# Patient Record
Sex: Female | Born: 1935 | Race: Black or African American | Hispanic: No | Smoking: Never smoker
Health system: Southern US, Community
[De-identification: ages and names within clinical notes are randomized; demographics above are authoritative.]

## PROBLEM LIST (undated history)

## (undated) DIAGNOSIS — I10 Essential (primary) hypertension: Secondary | ICD-10-CM

## (undated) DIAGNOSIS — H409 Unspecified glaucoma: Secondary | ICD-10-CM

## (undated) DIAGNOSIS — E7800 Pure hypercholesterolemia, unspecified: Secondary | ICD-10-CM

## (undated) HISTORY — PX: CATARACT EXTRACTION: SUR2

---

## 2003-08-18 ENCOUNTER — Ambulatory Visit (HOSPITAL_COMMUNITY): Admission: RE | Admit: 2003-08-18 | Discharge: 2003-08-18 | Payer: Self-pay | Admitting: Family Medicine

## 2004-08-22 ENCOUNTER — Ambulatory Visit (HOSPITAL_COMMUNITY): Admission: RE | Admit: 2004-08-22 | Discharge: 2004-08-22 | Payer: Self-pay | Admitting: Family Medicine

## 2004-09-05 ENCOUNTER — Ambulatory Visit (HOSPITAL_COMMUNITY): Admission: RE | Admit: 2004-09-05 | Discharge: 2004-09-05 | Payer: Self-pay | Admitting: Internal Medicine

## 2004-09-05 ENCOUNTER — Ambulatory Visit: Payer: Self-pay | Admitting: Internal Medicine

## 2005-09-19 ENCOUNTER — Ambulatory Visit (HOSPITAL_COMMUNITY): Admission: RE | Admit: 2005-09-19 | Discharge: 2005-09-19 | Payer: Self-pay | Admitting: Family Medicine

## 2006-10-16 ENCOUNTER — Ambulatory Visit (HOSPITAL_COMMUNITY): Admission: RE | Admit: 2006-10-16 | Discharge: 2006-10-16 | Payer: Self-pay | Admitting: Family Medicine

## 2007-10-31 ENCOUNTER — Ambulatory Visit (HOSPITAL_COMMUNITY): Admission: RE | Admit: 2007-10-31 | Discharge: 2007-10-31 | Payer: Self-pay | Admitting: Family Medicine

## 2008-11-16 ENCOUNTER — Ambulatory Visit (HOSPITAL_COMMUNITY): Admission: RE | Admit: 2008-11-16 | Discharge: 2008-11-16 | Payer: Self-pay | Admitting: Family Medicine

## 2009-11-18 ENCOUNTER — Ambulatory Visit (HOSPITAL_COMMUNITY): Admission: RE | Admit: 2009-11-18 | Discharge: 2009-11-18 | Payer: Self-pay | Admitting: Family Medicine

## 2010-10-28 ENCOUNTER — Other Ambulatory Visit (HOSPITAL_COMMUNITY): Payer: Self-pay | Admitting: Family Medicine

## 2010-10-28 DIAGNOSIS — Z139 Encounter for screening, unspecified: Secondary | ICD-10-CM

## 2010-11-21 ENCOUNTER — Ambulatory Visit (HOSPITAL_COMMUNITY): Admission: RE | Admit: 2010-11-21 | Payer: Medicare Other | Source: Ambulatory Visit

## 2010-12-06 ENCOUNTER — Ambulatory Visit (HOSPITAL_COMMUNITY)
Admission: RE | Admit: 2010-12-06 | Discharge: 2010-12-06 | Disposition: A | Discharge status: Home / Self Care | Payer: Medicare Other | Source: Ambulatory Visit | Attending: Family Medicine | Admitting: Family Medicine

## 2010-12-06 DIAGNOSIS — Z1231 Encounter for screening mammogram for malignant neoplasm of breast: Secondary | ICD-10-CM | POA: Insufficient documentation

## 2010-12-06 DIAGNOSIS — Z139 Encounter for screening, unspecified: Secondary | ICD-10-CM

## 2010-12-09 ENCOUNTER — Other Ambulatory Visit: Payer: Self-pay | Admitting: Family Medicine

## 2010-12-09 DIAGNOSIS — R928 Other abnormal and inconclusive findings on diagnostic imaging of breast: Secondary | ICD-10-CM

## 2010-12-26 ENCOUNTER — Other Ambulatory Visit (HOSPITAL_COMMUNITY): Payer: Self-pay | Admitting: Family Medicine

## 2010-12-26 DIAGNOSIS — M8580 Other specified disorders of bone density and structure, unspecified site: Secondary | ICD-10-CM

## 2011-01-10 ENCOUNTER — Ambulatory Visit (HOSPITAL_COMMUNITY)
Admission: RE | Admit: 2011-01-10 | Discharge: 2011-01-10 | Disposition: A | Discharge status: Home / Self Care | Payer: Medicare Other | Source: Ambulatory Visit | Attending: Family Medicine | Admitting: Family Medicine

## 2011-01-10 ENCOUNTER — Encounter (HOSPITAL_COMMUNITY): Payer: Self-pay

## 2011-01-10 DIAGNOSIS — M949 Disorder of cartilage, unspecified: Secondary | ICD-10-CM | POA: Insufficient documentation

## 2011-01-10 DIAGNOSIS — M8580 Other specified disorders of bone density and structure, unspecified site: Secondary | ICD-10-CM

## 2011-01-10 DIAGNOSIS — M899 Disorder of bone, unspecified: Secondary | ICD-10-CM | POA: Insufficient documentation

## 2011-01-10 HISTORY — DX: Essential (primary) hypertension: I10

## 2011-01-18 ENCOUNTER — Ambulatory Visit (HOSPITAL_COMMUNITY)
Admission: RE | Admit: 2011-01-18 | Discharge: 2011-01-18 | Disposition: A | Discharge status: Home / Self Care | Payer: Medicare Other | Source: Ambulatory Visit | Attending: Family Medicine | Admitting: Family Medicine

## 2011-01-18 ENCOUNTER — Other Ambulatory Visit: Payer: Self-pay | Admitting: Family Medicine

## 2011-01-18 DIAGNOSIS — R928 Other abnormal and inconclusive findings on diagnostic imaging of breast: Secondary | ICD-10-CM

## 2011-02-17 NOTE — Op Note (Signed)
NAME:  Shelby Phelps, Shelby Phelps                ACCOUNT NO.:  [id number]   MEDICAL RECORD NO.:  [id number]          PATIENT TYPE:  AMB   LOCATION:  DAY                           FACILITY:  [facility]   PHYSICIAN:  R. Roetta Sessions, M.D. DATE OF BIRTH:  1930/11/02   DATE OF PROCEDURE:  09/05/2004  DATE OF DISCHARGE:                                 OPERATIVE REPORT   PROCEDURE:  Screening colonoscopy.   INDICATION FOR PROCEDURE:  The patient is a 77 year old lady sent over out  of courtesy of Mila Homer. Sudie Bailey, M.D., for colorectal cancer screening.  She is devoid of any GI tract symptoms.  No family history of colorectal  neoplasia.  She has never had a colonoscopy.  Colonoscopy is now being done  as a screening maneuver.  The approach has been discussed with the patient  at length.  Potential risks, benefits, and alternatives have been reviewed,  questions answered, and she is agreeable.  Please see documentation in the  medical record.   PROCEDURE NOTE:  O2 saturation, blood pressure, pulse, and respirations were  monitored throughout the entire procedure.   CONSCIOUS SEDATION:  Versed 3 mg IV, Demerol 50 mg IV.   INSTRUMENT USED:  Olympus video chip system.   FINDINGS:  Digital exam revealed no abnormalities.   Endoscopic findings:  Prep was good.   Rectum:  Examination of the rectal mucosa including retroflexed view of the  anal verge revealed a single anal papilla, otherwise normal-appearing  mucosa.   Colon:  The colonic mucosa was surveyed from the rectosigmoid junction  through the left, transverse, and right colon, to the area of the  appendiceal orifice, ileocecal valve, and cecum.  These structures were well-  seen and photographed for the record.  From this level the scope was slowly  withdrawn and all previously-mentioned mucosal surfaces were again seen.  The colonic mucosa appeared normal.  The patient tolerated the procedure  well and was reacted in endoscopy.    IMPRESSION:  1.  Single anal papilla and otherwise normal rectum.  2.  Normal colon.   RECOMMENDATIONS:  Consider a screening colonoscopy in 10 years.     Otelia Sergeant   RMR/MEDQ  D:  09/05/2004  T:  09/05/2004  Job:  [id number]   cc:   Mila Homer. Sudie Bailey, M.D.  [address] [place], [place] [address]  Fax: [phone]

## 2012-02-05 ENCOUNTER — Other Ambulatory Visit (HOSPITAL_COMMUNITY): Payer: Self-pay | Admitting: Family Medicine

## 2012-02-05 DIAGNOSIS — Z139 Encounter for screening, unspecified: Secondary | ICD-10-CM

## 2012-02-12 ENCOUNTER — Ambulatory Visit (HOSPITAL_COMMUNITY)
Admission: RE | Admit: 2012-02-12 | Discharge: 2012-02-12 | Disposition: A | Discharge status: Home / Self Care | Payer: Medicare Other | Source: Ambulatory Visit | Attending: Family Medicine | Admitting: Family Medicine

## 2012-02-12 DIAGNOSIS — Z139 Encounter for screening, unspecified: Secondary | ICD-10-CM

## 2012-02-12 DIAGNOSIS — Z1231 Encounter for screening mammogram for malignant neoplasm of breast: Secondary | ICD-10-CM | POA: Insufficient documentation

## 2013-01-22 ENCOUNTER — Other Ambulatory Visit (HOSPITAL_COMMUNITY): Payer: Self-pay | Admitting: Family Medicine

## 2013-01-22 DIAGNOSIS — Z139 Encounter for screening, unspecified: Secondary | ICD-10-CM

## 2013-02-14 ENCOUNTER — Ambulatory Visit (HOSPITAL_COMMUNITY)
Admission: RE | Admit: 2013-02-14 | Discharge: 2013-02-14 | Disposition: A | Discharge status: Home / Self Care | Payer: Medicare Other | Source: Ambulatory Visit | Attending: Family Medicine | Admitting: Family Medicine

## 2013-02-14 DIAGNOSIS — Z1231 Encounter for screening mammogram for malignant neoplasm of breast: Secondary | ICD-10-CM | POA: Insufficient documentation

## 2013-02-14 DIAGNOSIS — Z139 Encounter for screening, unspecified: Secondary | ICD-10-CM

## 2014-02-02 ENCOUNTER — Other Ambulatory Visit (HOSPITAL_COMMUNITY): Payer: Self-pay | Admitting: Family Medicine

## 2014-02-02 DIAGNOSIS — Z1231 Encounter for screening mammogram for malignant neoplasm of breast: Secondary | ICD-10-CM

## 2014-02-16 ENCOUNTER — Ambulatory Visit (HOSPITAL_COMMUNITY): Payer: Medicare Other

## 2014-02-16 ENCOUNTER — Ambulatory Visit (HOSPITAL_COMMUNITY)
Admission: RE | Admit: 2014-02-16 | Discharge: 2014-02-16 | Disposition: A | Discharge status: Home / Self Care | Payer: Medicare Other | Source: Ambulatory Visit | Attending: Family Medicine | Admitting: Family Medicine

## 2014-02-16 DIAGNOSIS — Z1231 Encounter for screening mammogram for malignant neoplasm of breast: Secondary | ICD-10-CM

## 2014-11-25 ENCOUNTER — Other Ambulatory Visit (HOSPITAL_COMMUNITY): Payer: Self-pay | Admitting: Family Medicine

## 2014-11-25 DIAGNOSIS — M8580 Other specified disorders of bone density and structure, unspecified site: Secondary | ICD-10-CM

## 2014-12-01 ENCOUNTER — Ambulatory Visit (HOSPITAL_COMMUNITY)
Admission: RE | Admit: 2014-12-01 | Discharge: 2014-12-01 | Disposition: A | Discharge status: Home / Self Care | Payer: Medicare Other | Source: Ambulatory Visit | Attending: Family Medicine | Admitting: Family Medicine

## 2014-12-01 DIAGNOSIS — M8580 Other specified disorders of bone density and structure, unspecified site: Secondary | ICD-10-CM

## 2014-12-01 DIAGNOSIS — M899 Disorder of bone, unspecified: Secondary | ICD-10-CM | POA: Diagnosis present

## 2015-02-19 NOTE — Patient Instructions (Signed)
Your procedure is scheduled on:  Thursday, 02/25/15  Report to [facility] [address] at     1250   AM.  Call this number if you have problems the morning of surgery: [phone]   Remember:   Do not eat or drink   After Midnight.  Take these medicines the morning of surgery with A SIP OF WATER: ativan if needed   Do not wear jewelry, make-up or nail polish.  Do not wear lotions, powders, or perfumes. You may wear deodorant.  Do not bring valuables to the hospital.  Contacts, dentures or bridgework may not be worn into surgery.     Patients discharged the day of surgery will not be allowed to drive home.  Name and phone number of your driver: driver  Special Instructions: Use eye drops as directed.   Please read over the following fact sheets that you were given: Pain Booklet, Anesthesia Post-op Instructions and Care and Recovery After Surgery    Cataract Surgery  A cataract is a clouding of the lens of the eye. When a lens becomes cloudy, vision is reduced based on the degree and nature of the clouding. Surgery may be needed to improve vision. Surgery removes the cloudy lens and usually replaces it with a substitute lens (intraocular lens, IOL). LET YOUR EYE DOCTOR KNOW ABOUT:  Allergies to food or medicine.   Medicines taken including herbs, eyedrops, over-the-counter medicines, and creams.   Use of steroids (by mouth or creams).   Previous problems with anesthetics or numbing medicine.   History of bleeding problems or blood clots.   Previous surgery.   Other health problems, including diabetes and kidney problems.   Possibility of pregnancy, if this applies.  RISKS AND COMPLICATIONS  Infection.   Inflammation of the eyeball (endophthalmitis) that can spread to both eyes (sympathetic ophthalmia).   Poor wound healing.   If an IOL is inserted, it can later fall out of proper position. This is very uncommon.   Clouding of the part of your eye that holds an IOL in place. This is  called an "after-cataract." These are uncommon, but easily treated.  BEFORE THE PROCEDURE  Do not eat or drink anything except small amounts of water for 8 to 12 before your surgery, or as directed by your caregiver.   Unless you are told otherwise, continue any eyedrops you have been prescribed.   Talk to your primary caregiver about all other medicines that you take (both prescription and non-prescription). In some cases, you may need to stop or change medicines near the time of your surgery. This is most important if you are taking blood-thinning medicine.Do not stop medicines unless you are told to do so.   Arrange for someone to drive you to and from the procedure.   Do not put contact lenses in either eye on the day of your surgery.  PROCEDURE There is more than one method for safely removing a cataract. Your doctor can explain the differences and help determine which is best for you. Phacoemulsification surgery is the most common form of cataract surgery.  An injection is given behind the eye or eyedrops are given to make this a painless procedure.   A small cut (incision) is made on the edge of the clear, dome-shaped surface that covers the front of the eye (cornea).   A tiny probe is painlessly inserted into the eye. This device gives off ultrasound waves that soften and break up the cloudy center of the lens. This  makes it easier for the cloudy lens to be removed by suction.   An IOL may be implanted.   The normal lens of the eye is covered by a clear capsule. Part of that capsule is intentionally left in the eye to support the IOL.   Your surgeon may or may not use stitches to close the incision.  There are other forms of cataract surgery that require a larger incision and stiches to close the eye. This approach is taken in cases where the doctor feels that the cataract cannot be easily removed using phacoemulsification. AFTER THE PROCEDURE  When an IOL is implanted, it  does not need care. It becomes a permanent part of your eye and cannot be seen or felt.   Your doctor will schedule follow-up exams to check on your progress.   Review your other medicines with your doctor to see which can be resumed after surgery.   Use eyedrops or take medicine as prescribed by your doctor.  Document Released: 09/07/2011 Document Reviewed: 09/04/2011 [facility] Patient Information 2012 [place].  PATIENT INSTRUCTIONS POST-ANESTHESIA  IMMEDIATELY FOLLOWING SURGERY:  Do not drive or operate machinery for the first twenty four hours after surgery.  Do not make any important decisions for twenty four hours after surgery or while taking narcotic pain medications or sedatives.  If you develop intractable nausea and vomiting or a severe headache please notify your doctor immediately.  FOLLOW-UP:  Please make an appointment with your surgeon as instructed. You do not need to follow up with anesthesia unless specifically instructed to do so.  WOUND CARE INSTRUCTIONS (if applicable):  Keep a dry clean dressing on the anesthesia/puncture wound site if there is drainage.  Once the wound has quit draining you may leave it open to air.  Generally you should leave the bandage intact for twenty four hours unless there is drainage.  If the epidural site drains for more than 36-48 hours please call the anesthesia department.  QUESTIONS?:  Please feel free to call your physician or the hospital operator if you have any questions, and they will be happy to assist you.

## 2015-02-22 ENCOUNTER — Encounter (HOSPITAL_COMMUNITY)
Admission: RE | Admit: 2015-02-22 | Discharge: 2015-02-22 | Disposition: A | Discharge status: Home / Self Care | Payer: Medicare Other | Source: Ambulatory Visit | Attending: Ophthalmology | Admitting: Ophthalmology

## 2015-02-22 ENCOUNTER — Encounter (HOSPITAL_COMMUNITY): Payer: Self-pay

## 2015-02-22 ENCOUNTER — Other Ambulatory Visit: Payer: Self-pay

## 2015-02-22 DIAGNOSIS — Z79899 Other long term (current) drug therapy: Secondary | ICD-10-CM | POA: Diagnosis not present

## 2015-02-22 DIAGNOSIS — I4510 Unspecified right bundle-branch block: Secondary | ICD-10-CM | POA: Diagnosis not present

## 2015-02-22 DIAGNOSIS — H269 Unspecified cataract: Secondary | ICD-10-CM | POA: Diagnosis present

## 2015-02-22 DIAGNOSIS — R9431 Abnormal electrocardiogram [ECG] [EKG]: Secondary | ICD-10-CM | POA: Diagnosis not present

## 2015-02-22 DIAGNOSIS — I10 Essential (primary) hypertension: Secondary | ICD-10-CM | POA: Diagnosis not present

## 2015-02-22 DIAGNOSIS — H25811 Combined forms of age-related cataract, right eye: Secondary | ICD-10-CM | POA: Diagnosis not present

## 2015-02-22 HISTORY — DX: Pure hypercholesterolemia, unspecified: E78.00

## 2015-02-22 HISTORY — DX: Unspecified glaucoma: H40.9

## 2015-02-22 LAB — CBC
HCT: 40.8 % (ref 36.0–46.0)
Hemoglobin: 13.2 g/dL (ref 12.0–15.0)
MCH: 27.8 pg (ref 26.0–34.0)
MCHC: 32.4 g/dL (ref 30.0–36.0)
MCV: 86.1 fL (ref 78.0–100.0)
Platelets: 291 K/uL (ref 150–400)
RBC: 4.74 MIL/uL (ref 3.87–5.11)
RDW: 12.3 % (ref 11.5–15.5)
WBC: 4.3 K/uL (ref 4.0–10.5)

## 2015-02-22 LAB — BASIC METABOLIC PANEL WITH GFR
Anion gap: 7 (ref 5–15)
BUN: 9 mg/dL (ref 6–20)
CO2: 30 mmol/L (ref 22–32)
Calcium: 10.2 mg/dL (ref 8.9–10.3)
Chloride: 101 mmol/L (ref 101–111)
Creatinine, Ser: 0.67 mg/dL (ref 0.44–1.00)
GFR calc Af Amer: >60 mL/min (ref >60)
GFR calc non Af Amer: >60 mL/min (ref >60)
Glucose, Bld: 107 mg/dL — ABNORMAL HIGH (ref 65–99)
Potassium: 3.5 mmol/L (ref 3.5–5.1)
Sodium: 138 mmol/L (ref 135–145)

## 2015-02-22 NOTE — Pre-Procedure Instructions (Signed)
Patient given information to sign up for my chart at home. 

## 2015-02-24 MED ORDER — LIDOCAINE HCL (PF) 1 % IJ SOLN
INTRAMUSCULAR | Status: AC
  Filled 2015-02-24: qty 2

## 2015-02-24 MED ORDER — CYCLOPENTOLATE-PHENYLEPHRINE OP SOLN OPTIME - NO CHARGE
OPHTHALMIC | Status: AC
  Filled 2015-02-24: qty 2

## 2015-02-24 MED ORDER — TETRACAINE HCL 0.5 % OP SOLN
OPHTHALMIC | Status: AC
  Filled 2015-02-24: qty 2

## 2015-02-24 MED ORDER — NEOMYCIN-POLYMYXIN-DEXAMETH 3.5-10000-0.1 OP SUSP
OPHTHALMIC | Status: AC
  Filled 2015-02-24: qty 5

## 2015-02-24 MED ORDER — PHENYLEPHRINE HCL 2.5 % OP SOLN
OPHTHALMIC | Status: AC
  Filled 2015-02-24: qty 15

## 2015-02-24 MED ORDER — LIDOCAINE HCL 3.5 % OP GEL
OPHTHALMIC | Status: AC
  Filled 2015-02-24: qty 1

## 2015-02-25 ENCOUNTER — Ambulatory Visit (HOSPITAL_COMMUNITY)
Admission: RE | Admit: 2015-02-25 | Discharge: 2015-02-25 | Disposition: A | Discharge status: Home / Self Care | Payer: Medicare Other | Source: Ambulatory Visit | Attending: Ophthalmology | Admitting: Ophthalmology

## 2015-02-25 ENCOUNTER — Encounter (HOSPITAL_COMMUNITY): Payer: Self-pay | Admitting: Emergency Medicine

## 2015-02-25 ENCOUNTER — Ambulatory Visit (HOSPITAL_COMMUNITY): Payer: Medicare Other | Admitting: Anesthesiology

## 2015-02-25 ENCOUNTER — Encounter (HOSPITAL_COMMUNITY)
Admission: RE | Disposition: A | Discharge status: Home / Self Care | Payer: Self-pay | Source: Ambulatory Visit | Attending: Ophthalmology

## 2015-02-25 DIAGNOSIS — H25811 Combined forms of age-related cataract, right eye: Secondary | ICD-10-CM | POA: Diagnosis not present

## 2015-02-25 DIAGNOSIS — I10 Essential (primary) hypertension: Secondary | ICD-10-CM | POA: Diagnosis not present

## 2015-02-25 DIAGNOSIS — I4510 Unspecified right bundle-branch block: Secondary | ICD-10-CM | POA: Diagnosis not present

## 2015-02-25 DIAGNOSIS — R9431 Abnormal electrocardiogram [ECG] [EKG]: Secondary | ICD-10-CM | POA: Insufficient documentation

## 2015-02-25 DIAGNOSIS — Z79899 Other long term (current) drug therapy: Secondary | ICD-10-CM | POA: Insufficient documentation

## 2015-02-25 HISTORY — PX: CATARACT EXTRACTION W/PHACO: SHX586

## 2015-02-25 SURGERY — PHACOEMULSIFICATION, CATARACT, WITH IOL INSERTION
Anesthesia: Monitor Anesthesia Care | Site: Eye | Laterality: Right

## 2015-02-25 MED ORDER — EPINEPHRINE HCL 1 MG/ML IJ SOLN
INTRAOCULAR | Status: DC | PRN
  Administered 2015-02-25: 500 mL

## 2015-02-25 MED ORDER — NEOMYCIN-POLYMYXIN-DEXAMETH 3.5-10000-0.1 OP SUSP
OPHTHALMIC | Status: DC | PRN
  Administered 2015-02-25: 1 [drp] via OPHTHALMIC

## 2015-02-25 MED ORDER — LIDOCAINE HCL 3.5 % OP GEL
1.0000 "application " | Freq: Once | OPHTHALMIC | Status: AC
  Administered 2015-02-25: 1 via OPHTHALMIC

## 2015-02-25 MED ORDER — LIDOCAINE 3.5 % OP GEL OPTIME - NO CHARGE
OPHTHALMIC | Status: DC | PRN
  Administered 2015-02-25: 1 [drp] via OPHTHALMIC

## 2015-02-25 MED ORDER — LACTATED RINGERS IV SOLN
INTRAVENOUS | Status: DC
  Administered 2015-02-25: 75 mL/h via INTRAVENOUS
  Administered 2015-02-25: 10:00:00 via INTRAVENOUS

## 2015-02-25 MED ORDER — POVIDONE-IODINE 5 % OP SOLN
OPHTHALMIC | Status: DC | PRN
  Administered 2015-02-25: 1 via OPHTHALMIC

## 2015-02-25 MED ORDER — PROVISC 10 MG/ML IO SOLN
INTRAOCULAR | Status: DC | PRN
Start: 2015-02-25 — End: 2015-02-25
  Administered 2015-02-25: 0.85 mL via INTRAOCULAR

## 2015-02-25 MED ORDER — CYCLOPENTOLATE-PHENYLEPHRINE 0.2-1 % OP SOLN
1.0000 [drp] | OPHTHALMIC | Status: AC
  Administered 2015-02-25 (×3): 1 [drp] via OPHTHALMIC

## 2015-02-25 MED ORDER — PHENYLEPHRINE HCL 2.5 % OP SOLN
1.0000 [drp] | OPHTHALMIC | Status: AC
  Administered 2015-02-25 (×3): 1 [drp] via OPHTHALMIC

## 2015-02-25 MED ORDER — BSS IO SOLN
INTRAOCULAR | Status: DC | PRN
  Administered 2015-02-25: 15 mL via INTRAOCULAR

## 2015-02-25 MED ORDER — TETRACAINE HCL 0.5 % OP SOLN
1.0000 [drp] | OPHTHALMIC | Status: AC
  Administered 2015-02-25 (×3): 1 [drp] via OPHTHALMIC

## 2015-02-25 MED ORDER — LIDOCAINE HCL (PF) 1 % IJ SOLN
INTRAMUSCULAR | Status: DC | PRN
  Administered 2015-02-25: .5 mL

## 2015-02-25 MED ORDER — FENTANYL CITRATE (PF) 100 MCG/2ML IJ SOLN
25.0000 ug | Freq: Once | INTRAMUSCULAR | Status: AC
  Administered 2015-02-25: 25 ug via INTRAVENOUS

## 2015-02-25 MED ORDER — EPINEPHRINE HCL 1 MG/ML IJ SOLN
INTRAMUSCULAR | Status: AC
  Filled 2015-02-25: qty 1

## 2015-02-25 MED ORDER — MIDAZOLAM HCL 2 MG/2ML IJ SOLN
INTRAMUSCULAR | Status: AC
  Filled 2015-02-25: qty 2

## 2015-02-25 MED ORDER — FENTANYL CITRATE (PF) 100 MCG/2ML IJ SOLN
INTRAMUSCULAR | Status: AC
  Filled 2015-02-25: qty 2

## 2015-02-25 MED ORDER — MIDAZOLAM HCL 2 MG/2ML IJ SOLN
1.0000 mg | INTRAMUSCULAR | Status: DC | PRN
  Administered 2015-02-25: 2 mg via INTRAVENOUS

## 2015-02-25 SURGICAL SUPPLY — 34 items
CAPSULAR TENSION RING-AMO (OPHTHALMIC RELATED) IMPLANT
CLOTH BEACON ORANGE TIMEOUT ST (SAFETY) ×3 IMPLANT
EYE SHIELD UNIVERSAL CLEAR (GAUZE/BANDAGES/DRESSINGS) ×3 IMPLANT
GLOVE BIO SURGEON STRL SZ 6.5 (GLOVE) IMPLANT
GLOVE BIO SURGEONS STRL SZ 6.5 (GLOVE)
GLOVE BIOGEL PI IND STRL 6.5 (GLOVE) ×1 IMPLANT
GLOVE BIOGEL PI IND STRL 7.0 (GLOVE) IMPLANT
GLOVE BIOGEL PI IND STRL 7.5 (GLOVE) IMPLANT
GLOVE BIOGEL PI INDICATOR 6.5 (GLOVE) ×2
GLOVE BIOGEL PI INDICATOR 7.0 (GLOVE)
GLOVE BIOGEL PI INDICATOR 7.5 (GLOVE)
GLOVE ECLIPSE 6.5 STRL STRAW (GLOVE) IMPLANT
GLOVE ECLIPSE 7.0 STRL STRAW (GLOVE) IMPLANT
GLOVE ECLIPSE 7.5 STRL STRAW (GLOVE) IMPLANT
GLOVE EXAM NITRILE LRG STRL (GLOVE) IMPLANT
GLOVE EXAM NITRILE MD LF STRL (GLOVE) ×3 IMPLANT
GLOVE SKINSENSE NS SZ6.5 (GLOVE)
GLOVE SKINSENSE NS SZ7.0 (GLOVE)
GLOVE SKINSENSE STRL SZ6.5 (GLOVE) IMPLANT
GLOVE SKINSENSE STRL SZ7.0 (GLOVE) IMPLANT
KIT VITRECTOMY (OPHTHALMIC RELATED) IMPLANT
PAD ARMBOARD 7.5X6 YLW CONV (MISCELLANEOUS) ×3 IMPLANT
PROC W NO LENS (INTRAOCULAR LENS)
PROC W SPEC LENS (INTRAOCULAR LENS)
PROCESS W NO LENS (INTRAOCULAR LENS) IMPLANT
PROCESS W SPEC LENS (INTRAOCULAR LENS) IMPLANT
RETRACTOR IRIS SIGHTPATH (OPHTHALMIC RELATED) IMPLANT
RING MALYGIN (MISCELLANEOUS) IMPLANT
SIGHTPATH CAT PROC W REG LENS (Ophthalmic Related) ×3 IMPLANT
SYRINGE LUER LOK 1CC (MISCELLANEOUS) ×3 IMPLANT
TAPE SURG TRANSPORE 1 IN (GAUZE/BANDAGES/DRESSINGS) ×1 IMPLANT
TAPE SURGICAL TRANSPORE 1 IN (GAUZE/BANDAGES/DRESSINGS) ×2
VISCOELASTIC ADDITIONAL (OPHTHALMIC RELATED) IMPLANT
WATER STERILE IRR 250ML POUR (IV SOLUTION) ×3 IMPLANT

## 2015-02-25 NOTE — Transfer of Care (Signed)
Immediate Anesthesia Transfer of Care Note  Patient: Shelby Phelps  Procedure(s) Performed: Procedure(s): CATARACT EXTRACTION PHACO AND INTRAOCULAR LENS PLACEMENT:  CDE:  9.25 (Right)  Patient Location: PACU and Short Stay  Anesthesia Type:MAC  Level of Consciousness: awake, alert , oriented and patient cooperative  Airway & Oxygen Therapy: Patient Spontanous Breathing  Post-op Assessment: Report given to RN, Post -op Vital signs reviewed and stable and Patient moving all extremities X 4  Post vital signs: Reviewed and stable  Last Vitals:  Filed Vitals:   02/25/15 1005  BP: 146/63  Temp:   Resp: 14    Complications: No apparent anesthesia complications

## 2015-02-25 NOTE — Discharge Instructions (Signed)

## 2015-02-25 NOTE — Anesthesia Postprocedure Evaluation (Signed)
  Anesthesia Post-op Note  Patient: Shelby Phelps  Procedure(s) Performed: Procedure(s): CATARACT EXTRACTION PHACO AND INTRAOCULAR LENS PLACEMENT:  CDE:  9.25 (Right)  Patient Location: Short Stay  Anesthesia Type:MAC  Level of Consciousness: awake, alert , oriented and patient cooperative  Airway and Oxygen Therapy: Patient Spontanous Breathing  Post-op Pain: none  Post-op Assessment: Post-op Vital signs reviewed, Patient's Cardiovascular Status Stable, Respiratory Function Stable, Patent Airway, No signs of Nausea or vomiting and Pain level controlled  Post-op Vital Signs: Reviewed and stable  Last Vitals:  Filed Vitals:   02/25/15 1005  BP: 146/63  Temp:   Resp: 14    Complications: No apparent anesthesia complications

## 2015-02-25 NOTE — Anesthesia Preprocedure Evaluation (Addendum)
Anesthesia Evaluation  Patient identified by MRN, date of birth, ID band Patient awake    Reviewed: Allergy & Precautions, NPO status , Patient's Chart, lab work & pertinent test results  Airway Mallampati: II  TM Distance: >3 FB     Dental  (+) Edentulous Upper, Edentulous Lower   Pulmonary neg pulmonary ROS,  breath sounds clear to auscultation        Cardiovascular hypertension, Pt. on medications Rhythm:Regular Rate:Normal     Neuro/Psych    GI/Hepatic negative GI ROS,   Endo/Other    Renal/GU      Musculoskeletal   Abdominal   Peds  Hematology   Anesthesia Other Findings   Reproductive/Obstetrics                            Anesthesia Physical Anesthesia Plan  ASA: II  Anesthesia Plan: MAC   Post-op Pain Management:    Induction: Intravenous  Airway Management Planned: Nasal Cannula  Additional Equipment:   Intra-op Plan:   Post-operative Plan:   Informed Consent: I have reviewed the patients History and Physical, chart, labs and discussed the procedure including the risks, benefits and alternatives for the proposed anesthesia with the patient or authorized representative who has indicated his/her understanding and acceptance.     Plan Discussed with:   Anesthesia Plan Comments:         Anesthesia Quick Evaluation

## 2015-02-25 NOTE — Anesthesia Procedure Notes (Signed)
Procedure Name: MAC Date/Time: 02/25/2015 10:15 AM Performed by: Shary DecampMOSES, Elliannah Wayment Pre-anesthesia Checklist: Patient identified, Emergency Drugs available, Suction available, Timeout performed and Patient being monitored Patient Re-evaluated:Patient Re-evaluated prior to inductionOxygen Delivery Method: Nasal Cannula

## 2015-02-25 NOTE — Op Note (Signed)
Date of Admission: 02/25/2015  Date of Surgery: 02/25/2015   Pre-Op Dx: Cataract Right Eye  Post-Op Dx: Senile Combined Cataract Right  Eye,  Dx Code W29.562H25.811  Surgeon: Gemma PayorKerry Isidor Bromell, M.D.  Assistants: None  Anesthesia: Topical with MAC  Indications: Painless, progressive loss of vision with compromise of daily activities.  Surgery: Cataract Extraction with Intraocular lens Implant Right Eye  Discription: The patient had dilating drops and viscous lidocaine placed into the Right eye in the pre-op holding area. After transfer to the operating room, a time out was performed. The patient was then prepped and draped. Beginning with a 75 degree blade a paracentesis port was made at the surgeon's 2 o'clock position. The anterior chamber was then filled with 1% non-preserved lidocaine. This was followed by filling the anterior chamber with Provisc.  A 2.564mm keratome blade was used to make a clear corneal incision at the temporal limbus.  A bent cystatome needle was used to create a continuous tear capsulotomy. Hydrodissection was performed with balanced salt solution on a Fine canula. The lens nucleus was then removed using the phacoemulsification handpiece. Residual cortex was removed with the I&A handpiece. The anterior chamber and capsular bag were refilled with Provisc. A posterior chamber intraocular lens was placed into the capsular bag with it's injector. The implant was positioned with the Kuglan hook. The Provisc was then removed from the anterior chamber and capsular bag with the I&A handpiece. Stromal hydration of the main incision and paracentesis port was performed with BSS on a Fine canula. The wounds were tested for leak which was negative. The patient tolerated the procedure well. There were no operative complications. The patient was then transferred to the recovery room in stable condition.  Complications: None  Specimen: None  EBL: None  Prosthetic device: Hoya iSert 250, power 21.5  D, SN [id number].

## 2015-02-25 NOTE — H&P (Signed)
I have reviewed the H&P, the patient was re-examined, and I have identified no interval changes in medical condition and plan of care since the history and physical of record  

## 2015-02-26 ENCOUNTER — Encounter (HOSPITAL_COMMUNITY): Payer: Self-pay | Admitting: Ophthalmology

## 2015-02-26 NOTE — Addendum Note (Signed)
Addendum  created 02/26/15 1226 by Earleen NewportAmy A Ranson Belluomini, CRNA   Modules edited: Charges VN

## 2015-07-13 ENCOUNTER — Other Ambulatory Visit (HOSPITAL_COMMUNITY): Payer: Self-pay | Admitting: Family Medicine

## 2015-07-13 DIAGNOSIS — Z1231 Encounter for screening mammogram for malignant neoplasm of breast: Secondary | ICD-10-CM

## 2015-07-22 ENCOUNTER — Ambulatory Visit (HOSPITAL_COMMUNITY)
Admission: RE | Admit: 2015-07-22 | Discharge: 2015-07-22 | Disposition: A | Discharge status: Home / Self Care | Payer: Medicare Other | Source: Ambulatory Visit | Attending: Family Medicine | Admitting: Family Medicine

## 2015-07-22 DIAGNOSIS — Z1231 Encounter for screening mammogram for malignant neoplasm of breast: Secondary | ICD-10-CM

## 2021-01-12 ENCOUNTER — Other Ambulatory Visit (HOSPITAL_COMMUNITY): Payer: Self-pay | Admitting: Family Medicine

## 2021-01-12 DIAGNOSIS — Z1231 Encounter for screening mammogram for malignant neoplasm of breast: Secondary | ICD-10-CM

## 2021-01-14 ENCOUNTER — Ambulatory Visit (HOSPITAL_COMMUNITY)
Admission: RE | Admit: 2021-01-14 | Discharge: 2021-01-14 | Disposition: A | Discharge status: Home / Self Care | Payer: Medicare Other | Source: Ambulatory Visit | Attending: Family Medicine | Admitting: Family Medicine

## 2021-01-14 DIAGNOSIS — Z1231 Encounter for screening mammogram for malignant neoplasm of breast: Secondary | ICD-10-CM | POA: Insufficient documentation

## 2021-05-31 ENCOUNTER — Ambulatory Visit: Payer: Medicare Other | Admitting: Orthopedic Surgery

## 2021-06-01 ENCOUNTER — Ambulatory Visit: Payer: Medicare Other

## 2021-06-01 ENCOUNTER — Other Ambulatory Visit: Payer: Self-pay | Admitting: Orthopedic Surgery

## 2021-06-01 ENCOUNTER — Other Ambulatory Visit: Payer: Self-pay

## 2021-06-01 ENCOUNTER — Ambulatory Visit (INDEPENDENT_AMBULATORY_CARE_PROVIDER_SITE_OTHER): Payer: Medicare Other | Admitting: Orthopedic Surgery

## 2021-06-01 VITALS — BP 170/76 | HR 77 | Ht 65.0 in | Wt 118.0 lb

## 2021-06-01 DIAGNOSIS — M79641 Pain in right hand: Secondary | ICD-10-CM

## 2021-06-01 DIAGNOSIS — S52591A Other fractures of lower end of right radius, initial encounter for closed fracture: Secondary | ICD-10-CM

## 2021-06-01 DIAGNOSIS — M25531 Pain in right wrist: Secondary | ICD-10-CM

## 2021-06-02 ENCOUNTER — Encounter: Payer: Self-pay | Admitting: Orthopedic Surgery

## 2021-06-02 NOTE — Progress Notes (Signed)
New Patient Visit  Assessment: Shelby Phelps is a 87 y.o. female with the following: 1. Other closed fracture of distal end of right radius, initial encounter   Plan: Patient sustained a fall 3 to 4 weeks ago, and has had right wrist pain ever since.  Today is the first time that she is presented for evaluation.  Radiographs demonstrate a distal radius fracture, with comminution and intra-articular involvement.  There is dorsal displacement with dorsal angulation of the joint line.  However, she reports that her pain is significantly improved, and she has excellent range of motion given the severity of the fracture.  I discussed the usual treatment, which includes immobilization for 4 to 6 weeks, with transition to a removable wrist splint.  At this point, I offered her a wrist splint, but she states she is doing well.  She will continue with her current treatment including medications as needed.  Advised her to limit how much she lifts with the right hand for several more weeks, just to ensure her overall safety.  She stated her understanding.  She will let me know if she has any further issues.  Follow-up as needed.   Follow-up: Return if symptoms worsen or fail to improve.  Subjective:  Chief Complaint  Patient presents with   Hand Pain    Pt states she fell approx 3 wks ago and is still having pain and loss of grip in the right hand.     History of Present Illness: Shelby Phelps is a 87 y.o. female who presents for evaluation of right wrist pain.  Approximately 3 weeks ago, she states that she fell.  She landed on her right wrist.  She has had pain ever since, but has not been evaluated.  Her pain has gradually improved.  Her range of motion has gradually improved.  She has not been immobilized at all.  She states she has limited function of the right hand, but this is improving.  No numbness or tingling distally.  She lives at home, and does not use an assistive device for  ambulation.   Review of Systems: No fevers or chills No numbness or tingling No chest pain No shortness of breath No bowel or bladder dysfunction No GI distress No headaches   Medical History:  Past Medical History:  Diagnosis Date   Glaucoma    Hypercholesteremia    Hypertension     Past Surgical History:  Procedure Laterality Date   CATARACT EXTRACTION Left    [facility] 2006   CATARACT EXTRACTION W/PHACO Right 02/25/2015   Procedure: CATARACT EXTRACTION PHACO AND INTRAOCULAR LENS PLACEMENT:  CDE:  9.25;  Surgeon: Gemma Payor, MD;  Location: [facility];  Service: Ophthalmology;  Laterality: Right;    History reviewed. No pertinent family history. Social History   Tobacco Use   Smoking status: Never  Substance Use Topics   Alcohol use: No   Drug use: No    No Known Allergies  Current Meds  Medication Sig   atorvastatin (LIPITOR) 40 MG tablet Take 40 mg by mouth daily.   Cholecalciferol 5000 UNITS capsule Take 5,000 Units by mouth daily.   hydrochlorothiazide (HYDRODIURIL) 25 MG tablet Take 25 mg by mouth daily.   losartan (COZAAR) 25 MG tablet Take 25 mg by mouth daily.   traZODone (DESYREL) 50 MG tablet Take 50 mg by mouth at bedtime.   vitamin B-12 (CYANOCOBALAMIN) 100 MCG tablet Take 100 mcg by mouth daily.    Objective: BP Marland Kitchen)  170/76   Pulse 77   Ht 5\' 5"  (1.651 m)   Wt 118 lb (53.5 kg)   BMI 19.64 kg/m   Physical Exam:  General: Elderly female., Alert and oriented., and No acute distress. Gait: Slow, steady gait.  Evaluation of right wrist demonstrates mild swelling.  Mild tenderness to palpation about the distal radius.  She tolerates gentle flexion extension of the wrist.  She has active motion the wrist.  Sensation is intact distally in the superficial radial, median and ulnar nerve distribution.  Fingers are warm and well-perfused.  IMAGING: I personally ordered and reviewed the following images  X-rays of the right wrist demonstrates a  distal radius fracture.  There is comminution, with intra-articular involvement.  There is dorsal angulation at the joint line.  She also has some dorsal displacement at the distal radius.  DRUJ remains intact.  No ulnar styloid injury.  Impression: Intra-articular right distal radius fracture, with dorsal angulation of the joint line.  New Medications:  No orders of the defined types were placed in this encounter.     , MD  06/02/2021 8:25 AM

## 2022-01-12 ENCOUNTER — Other Ambulatory Visit (HOSPITAL_COMMUNITY): Payer: Self-pay | Admitting: Family Medicine

## 2022-01-12 DIAGNOSIS — Z1231 Encounter for screening mammogram for malignant neoplasm of breast: Secondary | ICD-10-CM

## 2022-01-13 ENCOUNTER — Other Ambulatory Visit (HOSPITAL_COMMUNITY): Payer: Self-pay | Admitting: Family Medicine

## 2022-01-13 DIAGNOSIS — Z780 Asymptomatic menopausal state: Secondary | ICD-10-CM

## 2022-01-16 ENCOUNTER — Ambulatory Visit (HOSPITAL_COMMUNITY)
Admission: RE | Admit: 2022-01-16 | Discharge: 2022-01-16 | Disposition: A | Discharge status: Home / Self Care | Payer: Medicare Other | Source: Ambulatory Visit | Attending: Family Medicine | Admitting: Family Medicine

## 2022-01-16 DIAGNOSIS — Z1231 Encounter for screening mammogram for malignant neoplasm of breast: Secondary | ICD-10-CM | POA: Diagnosis present

## 2022-01-16 DIAGNOSIS — Z780 Asymptomatic menopausal state: Secondary | ICD-10-CM | POA: Insufficient documentation

## 2022-01-16 DIAGNOSIS — Z13820 Encounter for screening for osteoporosis: Secondary | ICD-10-CM | POA: Insufficient documentation

## 2022-01-16 DIAGNOSIS — M810 Age-related osteoporosis without current pathological fracture: Secondary | ICD-10-CM | POA: Diagnosis not present

## 2022-01-28 ENCOUNTER — Emergency Department (HOSPITAL_COMMUNITY)
Admission: EM | Admit: 2022-01-28 | Discharge: 2022-01-28 | Disposition: A | Discharge status: Home / Self Care | Payer: Medicare Other | Attending: Emergency Medicine | Admitting: Emergency Medicine

## 2022-01-28 ENCOUNTER — Other Ambulatory Visit: Payer: Self-pay

## 2022-01-28 ENCOUNTER — Emergency Department (HOSPITAL_COMMUNITY): Payer: Medicare Other

## 2022-01-28 ENCOUNTER — Encounter (HOSPITAL_COMMUNITY): Payer: Self-pay | Admitting: Emergency Medicine

## 2022-01-28 DIAGNOSIS — S39012A Strain of muscle, fascia and tendon of lower back, initial encounter: Secondary | ICD-10-CM | POA: Insufficient documentation

## 2022-01-28 DIAGNOSIS — X58XXXA Exposure to other specified factors, initial encounter: Secondary | ICD-10-CM | POA: Insufficient documentation

## 2022-01-28 DIAGNOSIS — I10 Essential (primary) hypertension: Secondary | ICD-10-CM | POA: Diagnosis not present

## 2022-01-28 DIAGNOSIS — Z79899 Other long term (current) drug therapy: Secondary | ICD-10-CM | POA: Insufficient documentation

## 2022-01-28 DIAGNOSIS — S3992XA Unspecified injury of lower back, initial encounter: Secondary | ICD-10-CM | POA: Diagnosis present

## 2022-01-28 LAB — URINALYSIS, ROUTINE W REFLEX MICROSCOPIC
Bilirubin Urine: NEGATIVE
Glucose, UA: NEGATIVE mg/dL
Ketones, ur: NEGATIVE mg/dL
Leukocytes,Ua: NEGATIVE
Nitrite: NEGATIVE
Protein, ur: 30 mg/dL — AB
Specific Gravity, Urine: 1.019 (ref 1.005–1.030)
pH: 5 (ref 5.0–8.0)

## 2022-01-28 LAB — CBC WITH DIFFERENTIAL/PLATELET
Abs Immature Granulocytes: 0.01 10*3/uL (ref 0.00–0.07)
Basophils Absolute: 0 10*3/uL (ref 0.0–0.1)
Basophils Relative: 0 %
Eosinophils Absolute: 0 10*3/uL (ref 0.0–0.5)
Eosinophils Relative: 0 %
HCT: 39.8 % (ref 36.0–46.0)
Hemoglobin: 13.9 g/dL (ref 12.0–15.0)
Immature Granulocytes: 0 %
Lymphocytes Relative: 23 %
Lymphs Abs: 1.3 10*3/uL (ref 0.7–4.0)
MCH: 29.6 pg (ref 26.0–34.0)
MCHC: 34.9 g/dL (ref 30.0–36.0)
MCV: 84.7 fL (ref 80.0–100.0)
Monocytes Absolute: 0.4 10*3/uL (ref 0.1–1.0)
Monocytes Relative: 8 %
Neutro Abs: 3.6 10*3/uL (ref 1.7–7.7)
Neutrophils Relative %: 69 %
Platelets: 262 10*3/uL (ref 150–400)
RBC: 4.7 MIL/uL (ref 3.87–5.11)
RDW: 12.1 % (ref 11.5–15.5)
WBC: 5.4 10*3/uL (ref 4.0–10.5)
nRBC: 0 % (ref 0.0–0.2)

## 2022-01-28 LAB — BASIC METABOLIC PANEL
Anion gap: 8 (ref 5–15)
BUN: 11 mg/dL (ref 8–23)
CO2: 26 mmol/L (ref 22–32)
Calcium: 9.9 mg/dL (ref 8.9–10.3)
Chloride: 95 mmol/L — ABNORMAL LOW (ref 98–111)
Creatinine, Ser: 0.7 mg/dL (ref 0.44–1.00)
GFR, Estimated: >60 mL/min (ref >60)
Glucose, Bld: 124 mg/dL — ABNORMAL HIGH (ref 70–99)
Potassium: 3.7 mmol/L (ref 3.5–5.1)
Sodium: 129 mmol/L — ABNORMAL LOW (ref 135–145)

## 2022-01-28 MED ORDER — KETOROLAC TROMETHAMINE 15 MG/ML IJ SOLN
15.0000 mg | Freq: Once | INTRAMUSCULAR | Status: AC
  Administered 2022-01-28: 15 mg via INTRAMUSCULAR
  Filled 2022-01-28: qty 1

## 2022-01-28 MED ORDER — LIDOCAINE 5 % EX PTCH
1.0000 | MEDICATED_PATCH | CUTANEOUS | Status: DC
  Administered 2022-01-28: 1 via TRANSDERMAL
  Filled 2022-01-28: qty 1

## 2022-01-28 MED ORDER — HYDROCODONE-ACETAMINOPHEN 5-325 MG PO TABS
1.0000 | ORAL_TABLET | Freq: Once | ORAL | Status: AC
  Administered 2022-01-28: 1 via ORAL
  Filled 2022-01-28: qty 1

## 2022-01-28 MED ORDER — LIDOCAINE 5 % EX PTCH: 1.0000 | MEDICATED_PATCH | CUTANEOUS | 0 refills | Status: DC

## 2022-01-28 MED ORDER — NAPROXEN 375 MG PO TABS: 375.0000 mg | ORAL_TABLET | Freq: Two times a day (BID) | ORAL | 0 refills | Status: DC

## 2022-01-28 NOTE — Discharge Instructions (Signed)
Please follow-up with your primary care doctor for further evaluation.  Please take medication as prescribed.  Return to the emergency department for any worsening symptoms you might have. ?

## 2022-01-28 NOTE — ED Provider Notes (Signed)
?[facility] ?Provider Note ? ? ?CSN: [id number] ?Arrival date & time: 01/28/22  1428 ? ?  ? ?History ?Chief Complaint  ?Patient presents with  ? Back Pain  ? ? ?FILIPPA YARBOUGH is a 88 y.o. female with history of hypertension and hyperlipidemia who presents to the emergency department with a 1 day history of left lower back pain that radiates into the left oblique.  This started while she was sitting in her recliner.  She states the pain is worse with sitting and better with standing.  She states that she does feel the pain with rotation of the trunk.  Pain does not radiate down the legs.  She does report urinary frequency but denies any urinary urgency, dysuria, hematuria.  No fevers, chills, history of kidney stones. ? ? ?Back Pain ? ?  ? ?Home Medications ?Prior to Admission medications   ?Medication Sig Start Date End Date Taking? Authorizing Provider  ?atorvastatin (LIPITOR) 40 MG tablet Take 40 mg by mouth daily.   Yes [provider]  ?Cholecalciferol 5000 UNITS capsule Take 5,000 Units by mouth daily.   Yes [provider]  ?hydrochlorothiazide (HYDRODIURIL) 25 MG tablet Take 25 mg by mouth daily.   Yes [provider]  ?latanoprost (XALATAN) 0.005 % ophthalmic solution Place 1 drop into both eyes at bedtime. 01/04/22  Yes [provider]  ?lidocaine (LIDODERM) 5 % Place 1 patch onto the skin daily. Remove & Discard patch within 12 hours or as directed by MD 01/28/22  Yes Teressa Lower, PA-C  ?LORazepam (ATIVAN) 1 MG tablet Take 1 mg by mouth at bedtime.   Yes [provider]  ?losartan (COZAAR) 50 MG tablet Take 50 mg by mouth every morning. 12/07/21  Yes [provider]  ?naproxen (NAPROSYN) 375 MG tablet Take 1 tablet (375 mg total) by mouth 2 (two) times daily. 01/28/22  Yes Browning Southwood M, PA-C  ?traZODone (DESYREL) 50 MG tablet Take 50 mg by mouth at bedtime. 05/17/21  Yes [provider]  ?vitamin B-12 (CYANOCOBALAMIN)  100 MCG tablet Take 100 mcg by mouth daily.   Yes [provider]  ?   ? ?Allergies    ?Patient has no known allergies.   ? ?Review of Systems   ?Review of Systems  ?Musculoskeletal:  Positive for back pain.  ?All other systems reviewed and are negative. ? ?Physical Exam ?Updated Vital Signs ?BP (!) 185/82   Pulse 64   Temp 98.4 ?F (36.9 ?C) (Oral)   Resp 17   Ht 5\' 1"  (1.549 m)   Wt 52.2 kg   SpO2 100%   BMI 21.73 kg/m?  ?Physical Exam ?Vitals and nursing note reviewed.  ?Constitutional:   ?   Appearance: Normal appearance.  ?HENT:  ?   Head: Normocephalic and atraumatic.  ?Eyes:  ?   General:     ?   Right eye: No discharge.     ?   Left eye: No discharge.  ?   Conjunctiva/sclera: Conjunctivae normal.  ?Pulmonary:  ?   Effort: Pulmonary effort is normal.  ?Abdominal:  ?   General: Abdomen is flat.  ?   Palpations: Abdomen is soft.  ?   Tenderness: There is no abdominal tenderness. There is no right CVA tenderness or left CVA tenderness.  ?Musculoskeletal:  ?   Comments: No midline tenderness over the lumbar spine.  There is mild tenderness to the left paralumbar region.  Negative straight leg raise.  ?Skin: ?  General: Skin is warm and dry.  ?   Findings: No rash.  ?Neurological:  ?   General: No focal deficit present.  ?   Mental Status: She is alert.  ?Psychiatric:     ?   Mood and Affect: Mood normal.     ?   Behavior: Behavior normal.  ? ? ?ED Results / Procedures / Treatments   ?Labs ?(all labs ordered are listed, but only abnormal results are displayed) ?Labs Reviewed  ?URINALYSIS, ROUTINE W REFLEX MICROSCOPIC - Abnormal; Notable for the following components:  ?    Result Value  ? Hgb urine dipstick SMALL (*)   ? Protein, ur 30 (*)   ? Bacteria, UA RARE (*)   ? All other components within normal limits  ?BASIC METABOLIC PANEL - Abnormal; Notable for the following components:  ? Sodium 129 (*)   ? Chloride 95 (*)   ? Glucose, Bld 124 (*)   ? All other components within normal limits  ?CBC  WITH DIFFERENTIAL/PLATELET  ? ? ?EKG ?None ? ?Radiology ?CT Renal Stone Study ? ?Result Date: 01/28/2022 ?CLINICAL DATA:  A female at age 78 presents for evaluation of flank pain, suspected kidney stone. EXAM: CT ABDOMEN AND PELVIS WITHOUT CONTRAST TECHNIQUE: Multidetector CT imaging of the abdomen and pelvis was performed following the standard protocol without IV contrast. RADIATION DOSE REDUCTION: This exam was performed according to the departmental dose-optimization program which includes automated exposure control, adjustment of the mA and/or kV according to patient size and/or use of iterative reconstruction technique. COMPARISON:  None FINDINGS: Lower chest: Basilar atelectasis, no effusion or consolidation. Hepatobiliary: Smooth hepatic contours. No pericholecystic stranding. No visible hepatic lesion on noncontrast imaging. No biliary duct dilation grossly. Pancreas: Normal contour without signs of inflammation. Spleen: Normal in size and contour. Adrenals/Urinary Tract: Adrenal glands are normal. Smooth renal contours. Tiny areas of calcification at the papular tips. Small benign-appearing renal cyst in the lower pole the RIGHT kidney measuring 2 cm. (No follow-up recommended for this finding.) Ureters difficult to follow given the paucity of retroperitoneal fat. No discrete calculus is demonstrated. No urinary bladder wall thickening. Stomach/Bowel: Small hiatal hernia. No sign of small bowel dilation. Generalized stranding in the lower abdomen. Appendix is normal. No signs of dilation of the colon. Sigmoid diverticular changes. Abundant stool throughout the colon. Vascular/Lymphatic: Aortic atherosclerosis without aneurysmal dilation. No adenopathy in the abdomen or in the pelvis. Reproductive: Leiomyomas in the uterus with calcification. No adnexal masses. Limited assessment of reproductive structures on CT. Other: Generalized mesenteric edema in the lower abdomen. No focal fluid collection. No  pneumoperitoneum. Ovoid peripherally calcified lesion in the RIGHT groin measures 3.4 x 2.4 cm in greatest axial dimension and approximately 3 cm craniocaudal. No adjacent stranding. Musculoskeletal: Osteopenia. Spinal degenerative changes. Loss of height of the T12 vertebral body approximately 30% loss of height in this osteopenic patient. IMPRESSION: 1. Compression fracture at T12 approximately 30-40% loss of height is age indeterminate potentially acute. This could be an alternative cause for flank pain and should be correlated with physical exam. 2. Generalized mesenteric edema throughout the abdomen without focal bowel wall thickening. Findings are of uncertain significance, potentially related to enteritis though ultimately without clear cause. 3. Appendix is normal. 4. Tiny renal calculi in the bilateral kidneys, multiple calculi versus is calcifications of papillary tips. No definitive signs of nephrolithiasis or ureteral calculi. No perinephric stranding or hydronephrosis. 5. Ovoid peripherally calcified lesion in the RIGHT groin deep to the rectus muscle  just above the RIGHT pubic bone is of uncertain significance. This may represent sequela of prior trauma or surgery. Consider correlation with prior imaging or follow-up ultrasound as warranted for further assessment. No stranding around this area to suggest acute process. 6. Aortic atherosclerosis. Aortic Atherosclerosis (ICD10-I70.0). Electronically Signed   By: Donzetta KohutGeoffrey  Wile M.D.   On: 01/28/2022 17:13   ? ?Procedures ?Procedures  ? ? ?Medications Ordered in ED ?Medications  ?lidocaine (LIDODERM) 5 % 1 patch (1 patch Transdermal Patch Applied 01/28/22 1543)  ?HYDROcodone-acetaminophen (NORCO/VICODIN) 5-325 MG per tablet 1 tablet (1 tablet Oral Given 01/28/22 1650)  ?ketorolac (TORADOL) 15 MG/ML injection 15 mg (15 mg Intramuscular Given 01/28/22 1736)  ? ? ?ED Course/ Medical Decision Making/ A&P ?  ?                        ?Medical Decision  Making ?Amount and/or Complexity of Data Reviewed ?Labs: ordered. ?Radiology: ordered. ? ?Risk ?Prescription drug management. ? ? ?This patient presents to the ED for concern of left lower back pain with radiation to the left obliqu

## 2022-01-28 NOTE — ED Triage Notes (Signed)
Patient c/o low back pain that radiates into left flank. Denies any known injury. Per patient started last night while sitting. Per patient sitting makes pain worse. Denies any nausea, vomiting, diarrhea, or fever. Per patient urinary frequency. CNS intact.  ?

## 2022-03-02 IMAGING — MG MM DIGITAL SCREENING BILAT W/ TOMO AND CAD
8 series · 9 of 24 positions shown · non-contrast
Comparison: Previous exam(s).

CLINICAL DATA: Screening.

EXAM:
DIGITAL SCREENING BILATERAL MAMMOGRAM WITH TOMOSYNTHESIS AND CAD
TECHNIQUE: Bilateral screening digital craniocaudal and mediolateral oblique
mammograms were obtained. Bilateral screening digital breast
tomosynthesis was performed. The images were evaluated with
computer-aided detection.

[L MLO synth-2D]
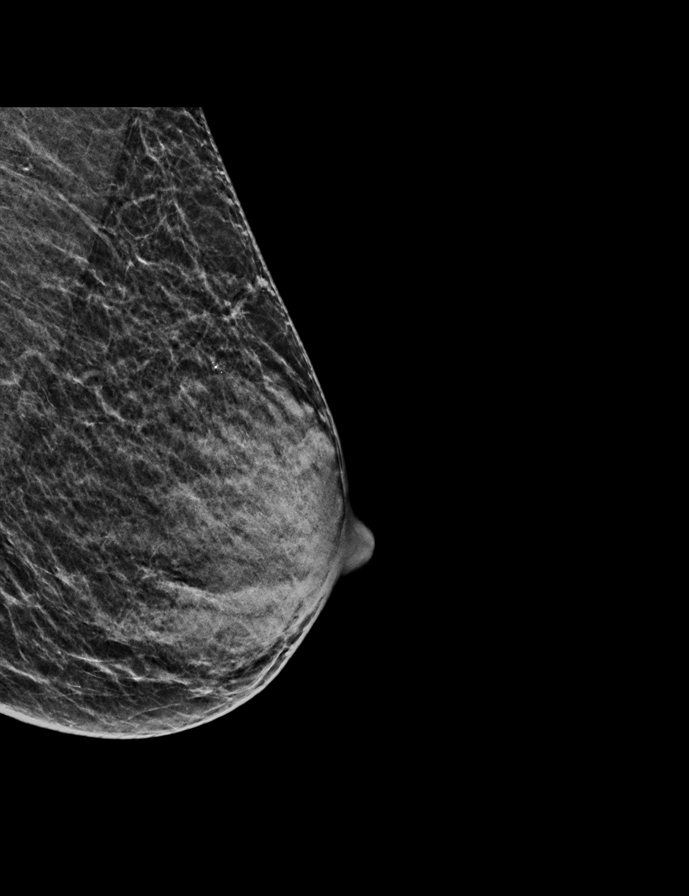

[L CC synth-2D]
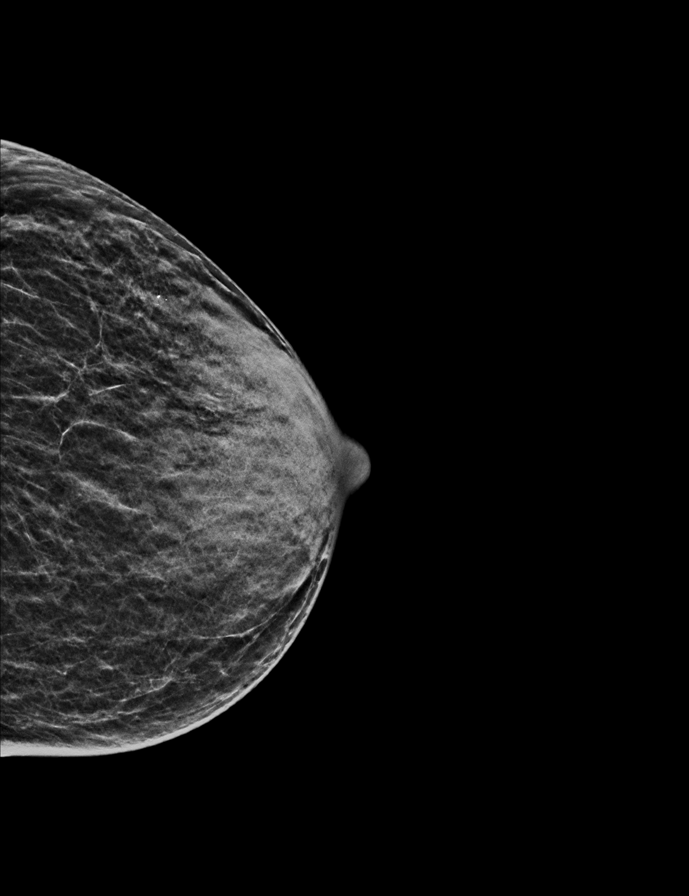

[R MLO synth-2D]
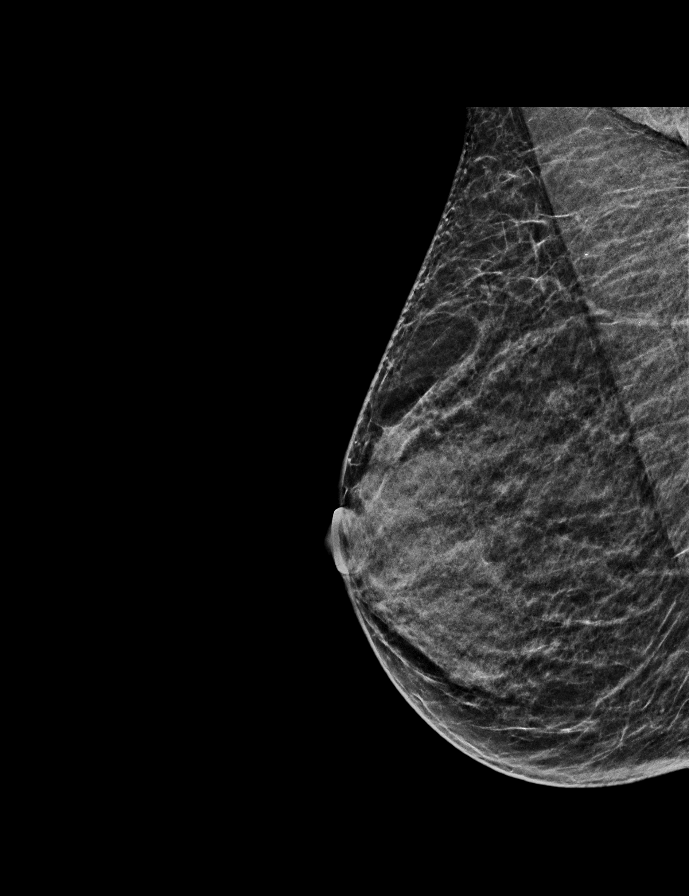

[R CC synth-2D]
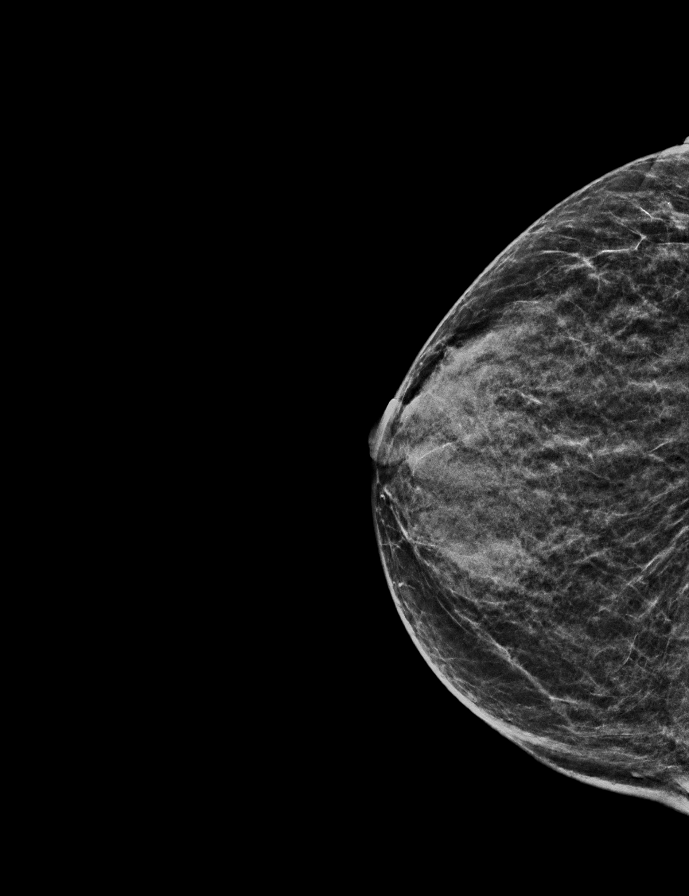

[L MLO tomo · 2 of 33 frames shown]
[frame 11/33]
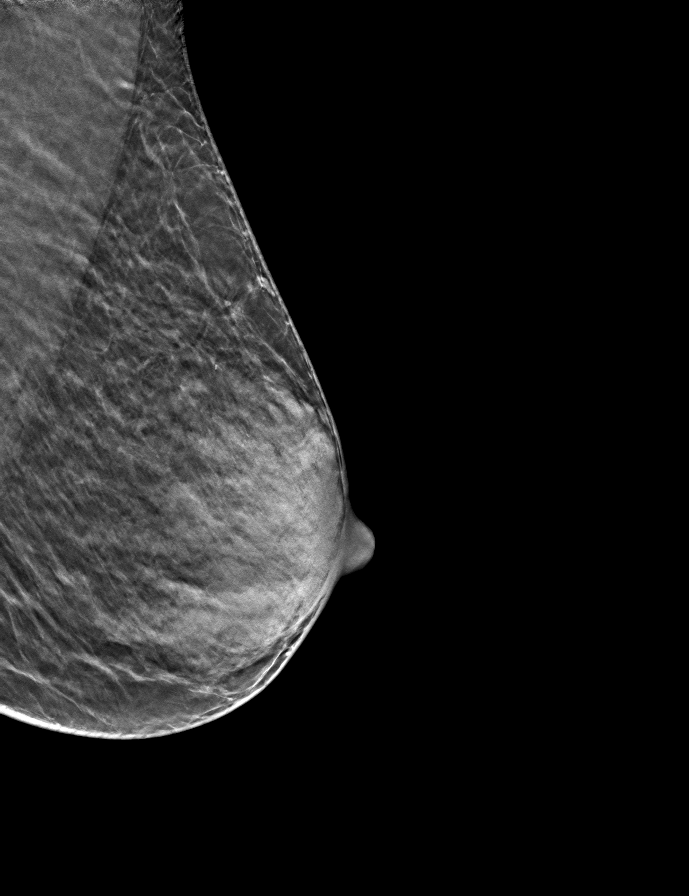
[frame 17/33]
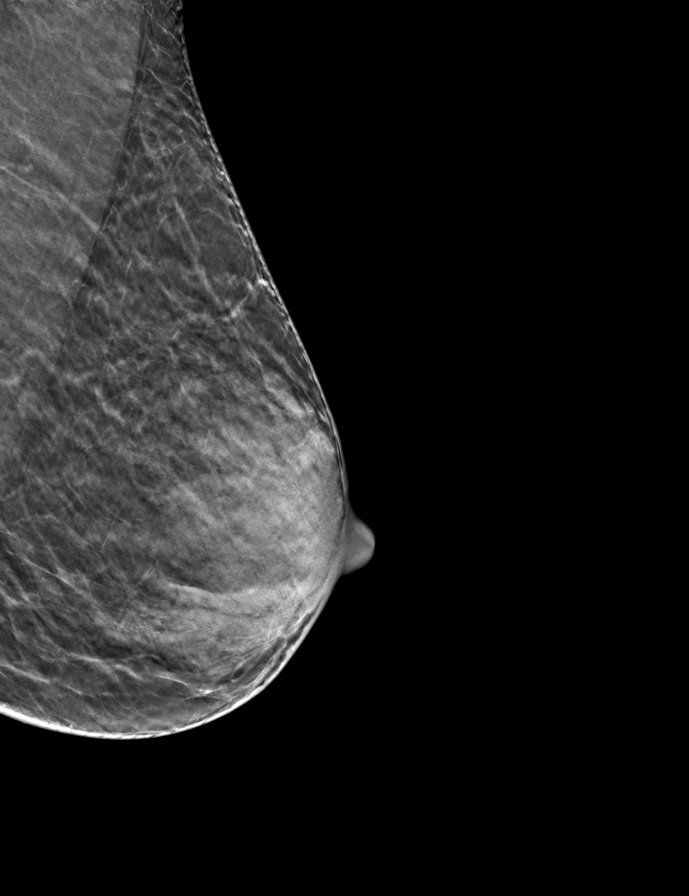

[R MLO tomo · tomo slice 19/38.0]
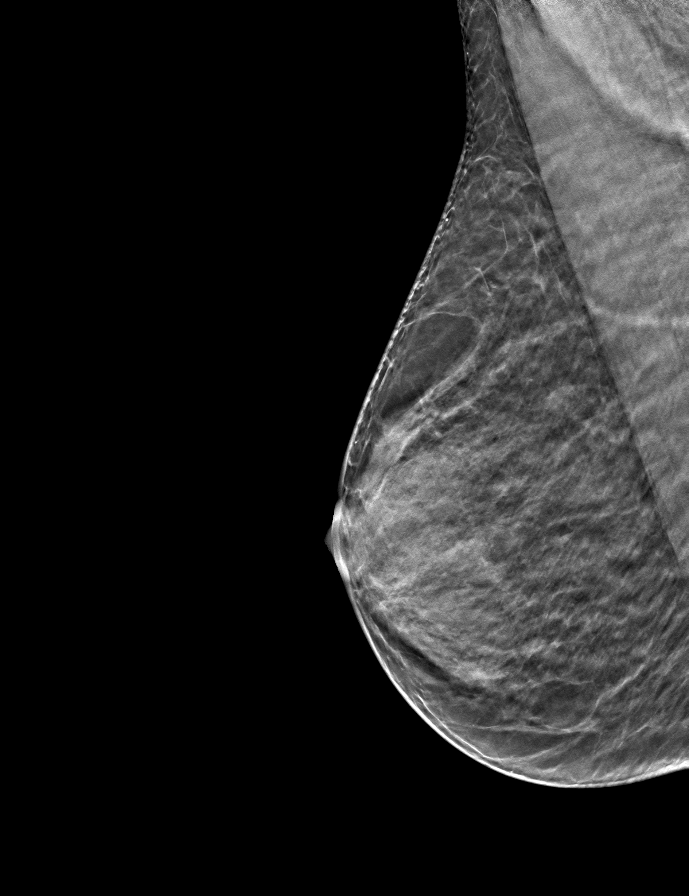

[R CC tomo · tomo slice 19/37.0]
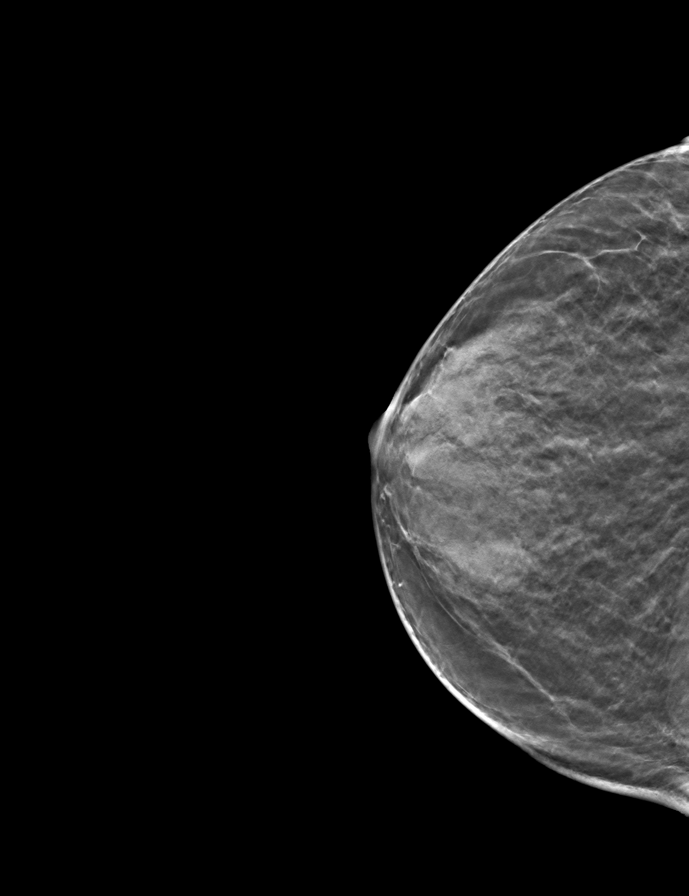

[L CC tomo · tomo slice 16/31.0]
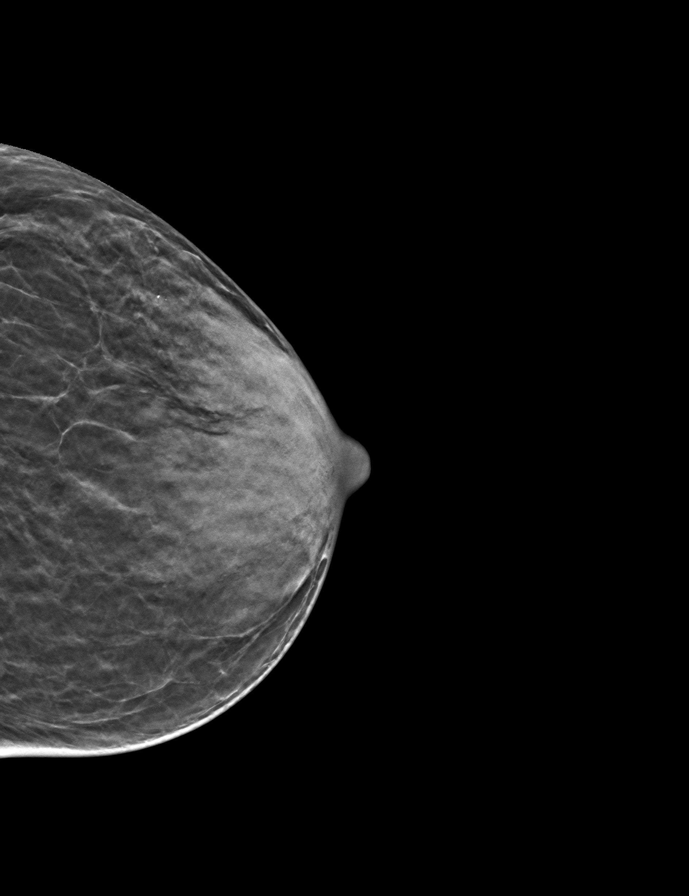

[9 of 24 positions shown; findings below may reference images not displayed]

ACR Breast Density Category c: The breast tissue is heterogeneously
dense, which may obscure small masses.
FINDINGS: There are no findings suspicious for malignancy. The images were
evaluated with computer-aided detection.
IMPRESSION: No mammographic evidence of malignancy. A result letter of this
screening mammogram will be mailed directly to the patient.

RECOMMENDATION:
Screening mammogram in one year. (Code:T4-5-GWO)

BI-RADS CATEGORY  1: Negative.

## 2022-11-30 ENCOUNTER — Encounter: Payer: Self-pay | Admitting: Radiology

## 2023-05-15 ENCOUNTER — Ambulatory Visit (INDEPENDENT_AMBULATORY_CARE_PROVIDER_SITE_OTHER): Payer: Medicare Other | Admitting: Internal Medicine

## 2023-05-15 ENCOUNTER — Encounter: Payer: Self-pay | Admitting: Internal Medicine

## 2023-05-15 VITALS — BP 128/62 | HR 52 | Ht 63.0 in | Wt 105.6 lb

## 2023-05-15 DIAGNOSIS — F5104 Psychophysiologic insomnia: Secondary | ICD-10-CM | POA: Diagnosis not present

## 2023-05-15 DIAGNOSIS — I10 Essential (primary) hypertension: Secondary | ICD-10-CM

## 2023-05-15 DIAGNOSIS — G4700 Insomnia, unspecified: Secondary | ICD-10-CM | POA: Insufficient documentation

## 2023-05-15 DIAGNOSIS — E785 Hyperlipidemia, unspecified: Secondary | ICD-10-CM | POA: Diagnosis not present

## 2023-05-15 NOTE — Assessment & Plan Note (Signed)
She endorses a history of hyperlipidemia and is currently prescribed atorvastatin 40 mg daily.  No recent lipid panel is available for review. -Repeat lipid panel at follow-up in 3 months.  Given her age, can discuss discontinuing atorvastatin based on age and absence of history of cardiovascular events.

## 2023-05-15 NOTE — Patient Instructions (Signed)
It was a pleasure to see you today.  Thank you for giving [place] the opportunity to be involved in your care.  Below is a brief recap of your visit and next steps.  We will plan to see you again in 6 months.  Summary You have established care today No medication changes have been made We will repeat labs at your next appointment Follow up in 3 months

## 2023-05-15 NOTE — Progress Notes (Signed)
New Patient Office Visit  Subjective    Patient ID: Shelby Phelps, female    DOB: 26-Dec-1930  Age: 89 y.o. MRN: [id number]  CC:  Chief Complaint  Patient presents with   Establish Care   HPI Shelby Phelps presents to establish care.  She is a 89 year old woman who endorses a past medical history significant for HTN, HLD, and insomnia.  Previously followed by Dr. Sudie Bailey.  [name] reports feeling well today.  She is asymptomatic and has no acute concerns to discuss aside from desiring to establish care.  She previously worked at [employer].  Denies a history of tobacco, alcohol, or illicit drug use.  Her family medical history is significant for lung cancer and CHF.  Chronic medical conditions and outstanding preventative care items discussed today are individually addressed A/P below.  Outpatient Encounter Medications as of 05/15/2023  Medication Sig   atorvastatin (LIPITOR) 40 MG tablet Take 40 mg by mouth daily.   Cholecalciferol 5000 UNITS capsule Take 5,000 Units by mouth daily.   latanoprost (XALATAN) 0.005 % ophthalmic solution Place 1 drop into both eyes at bedtime.   losartan (COZAAR) 100 MG tablet Take 100 mg by mouth every morning.   traZODone (DESYREL) 50 MG tablet Take 50 mg by mouth at bedtime.   vitamin B-12 (CYANOCOBALAMIN) 100 MCG tablet Take 100 mcg by mouth daily.   [DISCONTINUED] naproxen (NAPROSYN) 375 MG tablet Take 1 tablet (375 mg total) by mouth 2 (two) times daily.   [DISCONTINUED] hydrochlorothiazide (HYDRODIURIL) 25 MG tablet Take 25 mg by mouth daily.   [DISCONTINUED] lidocaine (LIDODERM) 5 % Place 1 patch onto the skin daily. Remove & Discard patch within 12 hours or as directed by MD   [DISCONTINUED] LORazepam (ATIVAN) 1 MG tablet Take 1 mg by mouth at bedtime.   No facility-administered encounter medications on file as of 05/15/2023.    Past Medical History:  Diagnosis Date   Glaucoma    Hypercholesteremia    Hypertension     Past Surgical  History:  Procedure Laterality Date   CATARACT EXTRACTION Left    [facility] 2006   CATARACT EXTRACTION W/PHACO Right 02/25/2015   Procedure: CATARACT EXTRACTION PHACO AND INTRAOCULAR LENS PLACEMENT:  CDE:  9.25;  Surgeon: Gemma Payor, MD;  Location: [facility];  Service: Ophthalmology;  Laterality: Right;    Family History  Problem Relation Age of Onset   Cancer Other     Social History   Socioeconomic History   Marital status: Widowed    Spouse name: Not on file   Number of children: Not on file   Years of education: Not on file   Highest education level: Not on file  Occupational History   Not on file  Tobacco Use   Smoking status: Never   Smokeless tobacco: Not on file  Vaping Use   Vaping status: Never Used  Substance and Sexual Activity   Alcohol use: No   Drug use: No   Sexual activity: Never    Birth control/protection: Post-menopausal  Other Topics Concern   Not on file  Social History Narrative   Not on file   Social Determinants of Health   Financial Resource Strain: Not on file  Food Insecurity: Not on file  Transportation Needs: Not on file  Physical Activity: Not on file  Stress: Not on file  Social Connections: Not on file  Intimate Partner Violence: Not on file   Review of Systems  Constitutional:  Negative for chills  and fever.  HENT:  Negative for sore throat.   Respiratory:  Negative for cough and shortness of breath.   Cardiovascular:  Negative for chest pain, palpitations and leg swelling.  Gastrointestinal:  Negative for abdominal pain, blood in stool, constipation, diarrhea, nausea and vomiting.  Genitourinary:  Negative for dysuria and hematuria.  Musculoskeletal:  Negative for myalgias.  Skin:  Negative for itching and rash.  Neurological:  Negative for dizziness and headaches.  Psychiatric/Behavioral:  Negative for depression and suicidal ideas.    Objective    BP 128/62   Pulse (!) 52   Ht 5\' 3"  (1.6 m)   Wt 105 lb 9.6 oz (47.9  kg)   SpO2 95%   BMI 18.71 kg/m   Physical Exam Vitals reviewed.  Constitutional:      General: She is not in acute distress.    Appearance: Normal appearance. She is not toxic-appearing.  HENT:     Head: Normocephalic and atraumatic.     Right Ear: External ear normal.     Left Ear: External ear normal.     Nose: Nose normal. No congestion or rhinorrhea.     Mouth/Throat:     Mouth: Mucous membranes are moist.     Pharynx: Oropharynx is clear. No oropharyngeal exudate or posterior oropharyngeal erythema.  Eyes:     General: No scleral icterus.    Extraocular Movements: Extraocular movements intact.     Conjunctiva/sclera: Conjunctivae normal.     Pupils: Pupils are equal, round, and reactive to light.  Cardiovascular:     Rate and Rhythm: Normal rate and regular rhythm.     Pulses: Normal pulses.     Heart sounds: Normal heart sounds. No murmur heard.    No friction rub. No gallop.  Pulmonary:     Effort: Pulmonary effort is normal.     Breath sounds: Normal breath sounds. No wheezing, rhonchi or rales.  Abdominal:     General: Abdomen is flat. Bowel sounds are normal. There is no distension.     Palpations: Abdomen is soft.     Tenderness: There is no abdominal tenderness.  Musculoskeletal:        General: No swelling. Normal range of motion.     Cervical back: Normal range of motion.     Right lower leg: No edema.     Left lower leg: No edema.  Lymphadenopathy:     Cervical: No cervical adenopathy.  Skin:    General: Skin is warm and dry.     Capillary Refill: Capillary refill takes less than 2 seconds.     Coloration: Skin is not jaundiced.  Neurological:     General: No focal deficit present.     Mental Status: She is alert and oriented to person, place, and time.  Psychiatric:        Mood and Affect: Mood normal.        Behavior: Behavior normal.    Assessment & Plan:   Problem List Items Addressed This Visit       Essential hypertension - Primary     Adequately controlled with losartan 100 mg daily.  BP today is 128/62.  No medication changes are indicated.      Hyperlipidemia    She endorses a history of hyperlipidemia and is currently prescribed atorvastatin 40 mg daily.  No recent lipid panel is available for review. -Repeat lipid panel at follow-up in 3 months.  Given her age, can discuss discontinuing atorvastatin based on age and  absence of history of cardiovascular events.      Insomnia    She is currently prescribed trazodone 50 mg nightly, which is effective in treating insomnia.  No medication changes have been made today.      Return in about 3 months (around 08/15/2023).   Billie Lade, MD

## 2023-05-15 NOTE — Assessment & Plan Note (Signed)
She is currently prescribed trazodone 50 mg nightly, which is effective in treating insomnia.  No medication changes have been made today.

## 2023-05-15 NOTE — Assessment & Plan Note (Signed)
Adequately controlled with losartan 100 mg daily.  BP today is 128/62.  No medication changes are indicated.

## 2023-07-03 ENCOUNTER — Ambulatory Visit (INDEPENDENT_AMBULATORY_CARE_PROVIDER_SITE_OTHER): Payer: Medicare Other

## 2023-07-03 VITALS — Ht 63.0 in | Wt 110.0 lb

## 2023-07-03 DIAGNOSIS — Z0000 Encounter for general adult medical examination without abnormal findings: Secondary | ICD-10-CM | POA: Diagnosis not present

## 2023-07-03 NOTE — Patient Instructions (Signed)
Shelby Phelps , Thank you for taking time to come for your Medicare Wellness Visit. I appreciate your ongoing commitment to your health goals. Please review the following plan we discussed and let me know if I can assist you in the future.   Referrals/Orders/Follow-Ups/Clinician Recommendations:  Next Medicare Annual Wellness Visit: October 08, 2024 @ 9:20 am virtual visit  This is a list of the screening recommended for you and due dates:  Health Maintenance  Topic Date Due   DTaP/Tdap/Td vaccine (1 - Tdap) Never done   Zoster (Shingles) Vaccine (1 of 2) Never done   Pneumonia Vaccine (1 of 1 - PCV) Never done   Flu Shot  Never done   COVID-19 Vaccine (1 - 2023-24 season) Never done   DEXA scan (bone density measurement)  01/17/2024   Medicare Annual Wellness Visit  07/02/2024   HPV Vaccine  Aged Out    Advanced directives: (Provided) Advance directive discussed with you today. I have provided a copy for you to complete at home and have notarized. Once this is complete, please bring a copy in to our office so we can scan it into your chart.   Next Medicare Annual Wellness Visit scheduled for next year: Yes  Preventive Care 28 Years and Older, Female Preventive care refers to lifestyle choices and visits with your health care provider that can promote health and wellness. Preventive care visits are also called wellness exams. What can I expect for my preventive care visit? Counseling Your health care provider may ask you questions about your: Medical history, including: Past medical problems. Family medical history. Pregnancy and menstrual history. History of falls. Current health, including: Memory and ability to understand (cognition). Emotional well-being. Home life and relationship well-being. Sexual activity and sexual health. Lifestyle, including: Alcohol, nicotine or tobacco, and drug use. Access to firearms. Diet, exercise, and sleep habits. Work and work  Astronomer. Sunscreen use. Safety issues such as seatbelt and bike helmet use. Physical exam Your health care provider will check your: Height and weight. These may be used to calculate your BMI (body mass index). BMI is a measurement that tells if you are at a healthy weight. Waist circumference. This measures the distance around your waistline. This measurement also tells if you are at a healthy weight and may help predict your risk of certain diseases, such as type 2 diabetes and high blood pressure. Heart rate and blood pressure. Body temperature. Skin for abnormal spots. What immunizations do I need?  Vaccines are usually given at various ages, according to a schedule. Your health care provider will recommend vaccines for you based on your age, medical history, and lifestyle or other factors, such as travel or where you work. What tests do I need? Screening Your health care provider may recommend screening tests for certain conditions. This may include: Lipid and cholesterol levels. Hepatitis C test. Hepatitis B test. HIV (human immunodeficiency virus) test. STI (sexually transmitted infection) testing, if you are at risk. Lung cancer screening. Colorectal cancer screening. Diabetes screening. This is done by checking your blood sugar (glucose) after you have not eaten for a while (fasting). Mammogram. Talk with your health care provider about how often you should have regular mammograms. BRCA-related cancer screening. This may be done if you have a family history of breast, ovarian, tubal, or peritoneal cancers. Bone density scan. This is done to screen for osteoporosis. Talk with your health care provider about your test results, treatment options, and if necessary, the need for more  tests. Follow these instructions at home: Eating and drinking  Eat a diet that includes fresh fruits and vegetables, whole grains, lean protein, and low-fat dairy products. Limit your intake of  foods with high amounts of sugar, saturated fats, and salt. Take vitamin and mineral supplements as recommended by your health care provider. Do not drink alcohol if your health care provider tells you not to drink. If you drink alcohol: Limit how much you have to 0-1 drink a day. Know how much alcohol is in your drink. In the [place] one drink equals one 12 oz bottle of beer (355 mL), one 5 oz glass of wine (148 mL), or one 1 oz glass of hard liquor (44 mL). Lifestyle Brush your teeth every morning and night with fluoride toothpaste. Floss one time each day. Exercise for at least 30 minutes 5 or more days each week. Do not use any products that contain nicotine or tobacco. These products include cigarettes, chewing tobacco, and vaping devices, such as e-cigarettes. If you need help quitting, ask your health care provider. Do not use drugs. If you are sexually active, practice safe sex. Use a condom or other form of protection in order to prevent STIs. Take aspirin only as told by your health care provider. Make sure that you understand how much to take and what form to take. Work with your health care provider to find out whether it is safe and beneficial for you to take aspirin daily. Ask your health care provider if you need to take a cholesterol-lowering medicine (statin). Find healthy ways to manage stress, such as: Meditation, yoga, or listening to music. Journaling. Talking to a trusted person. Spending time with friends and family. Minimize exposure to UV radiation to reduce your risk of skin cancer. Safety Always wear your seat belt while driving or riding in a vehicle. Do not drive: If you have been drinking alcohol. Do not ride with someone who has been drinking. When you are tired or distracted. While texting. If you have been using any mind-altering substances or drugs. Wear a helmet and other protective equipment during sports activities. If you have firearms in your house,  make sure you follow all gun safety procedures. What's next? Visit your health care provider once a year for an annual wellness visit. Ask your health care provider how often you should have your eyes and teeth checked. Stay up to date on all vaccines. This information is not intended to replace advice given to you by your health care provider. Make sure you discuss any questions you have with your health care provider. Document Revised: 03/16/2021 Document Reviewed: 03/16/2021 Elsevier Patient Education  2024 [employer]. Understanding Your Risk for Falls Millions of people have serious injuries from falls each year. It is important to understand your risk of falling. Talk with your health care provider about your risk and what you can do to lower it. If you do have a serious fall, make sure to tell your provider. Falling once raises your risk of falling again. How can falls affect me? Serious injuries from falls are common. These include: Broken bones, such as hip fractures. Head injuries, such as traumatic brain injuries (TBI) or concussions. A fear of falling can cause you to avoid activities and stay at home. This can make your muscles weaker and raise your risk for a fall. What can increase my risk? There are a number of risk factors that increase your risk for falling. The more risk factors you have,  the higher your risk of falling. Serious injuries from a fall happen most often to people who are older than 87 years old. Teenagers and young adults ages 31-29 are also at higher risk. Common risk factors include: Weakness in the lower body. Being generally weak or confused due to long-term (chronic) illness. Dizziness or balance problems. Poor vision. Medicines that cause dizziness or drowsiness. These may include: Medicines for your blood pressure, heart, anxiety, insomnia, or swelling (edema). Pain medicines. Muscle relaxants. Other risk factors include: Drinking alcohol. Having  had a fall in the past. Having foot pain or wearing improper footwear. Working at a dangerous job. Having any of the following in your home: Tripping hazards, such as floor clutter or loose rugs. Poor lighting. Pets. Having dementia or memory loss. What actions can I take to lower my risk of falling?     Physical activity Stay physically fit. Do strength and balance exercises. Consider taking a regular class to build strength and balance. Yoga and tai chi are good options. Vision Have your eyes checked every year and your prescription for glasses or contacts updated as needed. Shoes and walking aids Wear non-skid shoes. Wear shoes that have rubber soles and low heels. Do not wear high heels. Do not walk around the house in socks or slippers. Use a cane or walker as told by your provider. Home safety Attach secure railings on both sides of your stairs. Install grab bars for your bathtub, shower, and toilet. Use a non-skid mat in your bathtub or shower. Attach bath mats securely with double-sided, non-slip rug tape. Use good lighting in all rooms. Keep a flashlight near your bed. Make sure there is a clear path from your bed to the bathroom. Use night-lights. Do not use throw rugs. Make sure all carpeting is taped or tacked down securely. Remove all clutter from walkways and stairways, including extension cords. Repair uneven or broken steps and floors. Avoid walking on icy or slippery surfaces. Walk on the grass instead of on icy or slick sidewalks. Use ice melter to get rid of ice on walkways in the winter. Use a cordless phone. Questions to ask your health care provider Can you help me check my risk for a fall? Do any of my medicines make me more likely to fall? Should I take a vitamin D supplement? What exercises can I do to improve my strength and balance? Should I make an appointment to have my vision checked? Do I need a bone density test to check for weak bones  (osteoporosis)? Would it help to use a cane or a walker? Where to find more information Centers for Disease Control and Prevention, STEADI: TonerPromos.no Community-Based Fall Prevention Programs: TonerPromos.no [employer] on Aging: BaseRingTones.pl Contact a health care provider if: You fall at home. You are afraid of falling at home. You feel weak, drowsy, or dizzy. This information is not intended to replace advice given to you by your health care provider. Make sure you discuss any questions you have with your health care provider. Document Revised: 05/22/2022 Document Reviewed: 05/22/2022 Elsevier Patient Education  2024 [employer].

## 2023-07-03 NOTE — Progress Notes (Signed)
Because this visit was a virtual/telehealth visit,  certain criteria was not obtained, such a blood pressure, CBG if applicable, and timed get up and go. Any medications not marked as "taking" were not mentioned during the medication reconciliation part of the visit. Any vitals not documented were not able to be obtained due to this being a telehealth visit or patient was unable to self-report a recent blood pressure reading due to a lack of equipment at home via telehealth. Vitals that have been documented are verbally provided by the patient.   Subjective:   Shelby Phelps is a 89 y.o. female who presents for Medicare Annual (Subsequent) preventive examination.  Visit Complete: Virtual  I connected with  Shelby Phelps on 07/03/23 by a audio enabled telemedicine application and verified that I am speaking with the correct person using two identifiers.  Patient Location: Home  Provider Location: Home Office  I discussed the limitations of evaluation and management by telemedicine. The patient expressed understanding and agreed to proceed.  Patient Medicare AWV questionnaire was completed by the patient on n/a; I have confirmed that all information answered by patient is correct and no changes since this date.  Cardiac Risk Factors include: advanced age (>77men, >58 women);dyslipidemia;hypertension;sedentary lifestyle     Objective:    Today's Vitals   07/03/23 1425  Weight: 110 lb (49.9 kg)  Height: 5\' 3"  (1.6 m)   Body mass index is 19.49 kg/m.     07/03/2023    2:24 PM 01/28/2022    2:39 PM 02/25/2015    9:31 AM 02/22/2015    9:20 AM  Advanced Directives  Does Patient Have a Medical Advance Directive? No No No No  Would patient like information on creating a medical advance directive? No - Patient declined       Current Medications (verified) Outpatient Encounter Medications as of 07/03/2023  Medication Sig   atorvastatin (LIPITOR) 40 MG tablet Take 40 mg by mouth daily.    Cholecalciferol 5000 UNITS capsule Take 5,000 Units by mouth daily.   latanoprost (XALATAN) 0.005 % ophthalmic solution Place 1 drop into both eyes at bedtime.   losartan (COZAAR) 100 MG tablet Take 100 mg by mouth every morning.   traZODone (DESYREL) 50 MG tablet Take 50 mg by mouth at bedtime.   vitamin B-12 (CYANOCOBALAMIN) 100 MCG tablet Take 100 mcg by mouth daily.   No facility-administered encounter medications on file as of 07/03/2023.    Allergies (verified) Patient has no known allergies.   History: Past Medical History:  Diagnosis Date   Glaucoma    Hypercholesteremia    Hypertension    Past Surgical History:  Procedure Laterality Date   CATARACT EXTRACTION Left    [facility] 2006   CATARACT EXTRACTION W/PHACO Right 02/25/2015   Procedure: CATARACT EXTRACTION PHACO AND INTRAOCULAR LENS PLACEMENT:  CDE:  9.25;  Surgeon: Gemma Payor, MD;  Location: [facility];  Service: Ophthalmology;  Laterality: Right;   Family History  Problem Relation Age of Onset   Cancer Other    Social History   Socioeconomic History   Marital status: Widowed    Spouse name: Not on file   Number of children: Not on file   Years of education: Not on file   Highest education level: Not on file  Occupational History   Not on file  Tobacco Use   Smoking status: Never   Smokeless tobacco: Not on file  Vaping Use   Vaping status: Never Used  Substance and  Sexual Activity   Alcohol use: No   Drug use: No   Sexual activity: Never    Birth control/protection: Post-menopausal  Other Topics Concern   Not on file  Social History Narrative   Not on file   Social Determinants of Health   Financial Resource Strain: Low Risk  (07/03/2023)   Overall Financial Resource Strain (CARDIA)    Difficulty of Paying Living Expenses: Not hard at all  Food Insecurity: No Food Insecurity (07/03/2023)   Hunger Vital Sign    Worried About Running Out of Food in the Last Year: Never true    Ran Out of Food in  the Last Year: Never true  Transportation Needs: No Transportation Needs (07/03/2023)   PRAPARE - Administrator, Civil Service (Medical): No    Lack of Transportation (Non-Medical): No  Physical Activity: Insufficiently Active (07/03/2023)   Exercise Vital Sign    Days of Exercise per Week: 7 days    Minutes of Exercise per Session: 20 min  Stress: No Stress Concern Present (07/03/2023)   [employer] of Occupational Health - Occupational Stress Questionnaire    Feeling of Stress : Not at all  Social Connections: Moderately Isolated (07/03/2023)   Social Connection and Isolation Panel [NHANES]    Frequency of Communication with Friends and Family: More than three times a week    Frequency of Social Gatherings with Friends and Family: More than three times a week    Attends Religious Services: More than 4 times per year    Active Member of [employer] or Organizations: No    Attends Banker Meetings: Never    Marital Status: Widowed    Tobacco Counseling Counseling given: Yes   Clinical Intake:  Pre-visit preparation completed: Yes  Pain : No/denies pain     BMI - recorded: 19.49 Nutritional Risks: None Diabetes: No  How often do you need to have someone help you when you read instructions, pamphlets, or other written materials from your doctor or pharmacy?: 1 - Never  Interpreter Needed?: No  Information entered by :: Abby Laila Myhre, CMA   Activities of Daily Living    07/03/2023    2:33 PM  In your present state of health, do you have any difficulty performing the following activities:  Hearing? 0  Vision? 0  Difficulty concentrating or making decisions? 0  Walking or climbing stairs? 1  Comment uses cane  Dressing or bathing? 0  Doing errands, shopping? 0  Preparing Food and eating ? N  Using the Toilet? N  In the past six months, have you accidently leaked urine? N  Do you have problems with loss of bowel control? N  Managing your  Medications? N  Managing your Finances? N  Housekeeping or managing your Housekeeping? N    Patient Care Team: Billie Lade, MD as PCP - General (Internal Medicine)  Indicate any recent Medical Services you may have received from other than [facility] providers in the past year (date may be approximate).     Assessment:   This is a routine wellness examination for Shelby Phelps.  Hearing/Vision screen Hearing Screening - Comments:: Patient denies any hearing difficulties.   Vision Screening - Comments:: Wears rx glasses - up to date with routine eye exams with [facility] in [place]   Goals Addressed             This Visit's Progress    Patient Stated       To remain  active and independent        Depression Screen    07/03/2023    2:28 PM 05/15/2023    1:48 PM  PHQ 2/9 Scores  PHQ - 2 Score 0 0  PHQ- 9 Score 0 2    Fall Risk    07/03/2023    2:33 PM 05/15/2023    1:48 PM  Fall Risk   Falls in the past year? 0 0  Number falls in past yr: 0 0  Injury with Fall? 0 0  Risk for fall due to : No Fall Risks No Fall Risks  Follow up Falls prevention discussed Falls evaluation completed    MEDICARE RISK AT HOME: Medicare Risk at Home Any stairs in or around the home?: No If so, are there any without handrails?: No Home free of loose throw rugs in walkways, pet beds, electrical cords, etc?: Yes Adequate lighting in your home to reduce risk of falls?: Yes Life alert?: No Use of a cane, walker or w/c?: Yes Grab bars in the bathroom?: Yes Shower chair or bench in shower?: No Elevated toilet seat or a handicapped toilet?: No  TIMED UP AND GO:  Was the test performed?  No    Cognitive Function:        07/03/2023    2:28 PM  6CIT Screen  What Year? 0 points  What month? 0 points  What time? 0 points  Count back from 20 0 points  Months in reverse 0 points  Repeat phrase 0 points  Total Score 0 points    Immunizations  There is no immunization history on file for  this patient.  TDAP status: Due, Education has been provided regarding the importance of this vaccine. Advised may receive this vaccine at local pharmacy or Health Dept. Aware to provide a copy of the vaccination record if obtained from local pharmacy or Health Dept. Verbalized acceptance and understanding.  Flu Vaccine status: Due, Education has been provided regarding the importance of this vaccine. Advised may receive this vaccine at local pharmacy or Health Dept. Aware to provide a copy of the vaccination record if obtained from local pharmacy or Health Dept. Verbalized acceptance and understanding.  Pneumococcal vaccine status: Due, Education has been provided regarding the importance of this vaccine. Advised may receive this vaccine at local pharmacy or Health Dept. Aware to provide a copy of the vaccination record if obtained from local pharmacy or Health Dept. Verbalized acceptance and understanding.  Covid-19 vaccine status: Information provided on how to obtain vaccines.   Qualifies for Shingles Vaccine? Yes   Zostavax completed No   Shingrix Completed?: No.    Education has been provided regarding the importance of this vaccine. Patient has been advised to call insurance company to determine out of pocket expense if they have not yet received this vaccine. Advised may also receive vaccine at local pharmacy or Health Dept. Verbalized acceptance and understanding.  Screening Tests Health Maintenance  Topic Date Due   Medicare Annual Wellness (AWV)  Never done   DTaP/Tdap/Td (1 - Tdap) Never done   Zoster Vaccines- Shingrix (1 of 2) Never done   Pneumonia Vaccine 73+ Years old (1 of 1 - PCV) Never done   INFLUENZA VACCINE  Never done   COVID-19 Vaccine (1 - 2023-24 season) Never done   DEXA SCAN  Completed   HPV VACCINES  Aged Out    Health Maintenance  Health Maintenance Due  Topic Date Due   Medicare Annual Wellness (AWV)  Never done   DTaP/Tdap/Td (1 - Tdap) Never done    Zoster Vaccines- Shingrix (1 of 2) Never done   Pneumonia Vaccine 73+ Years old (1 of 1 - PCV) Never done   INFLUENZA VACCINE  Never done   COVID-19 Vaccine (1 - 2023-24 season) Never done    Colorectal cancer screening: No longer required.   Mammogram status: No longer required due to age.  Bone Density status: Completed 01/16/2022. Results reflect: Bone density results: OSTEOPOROSIS. Repeat every 2 years.  Lung Cancer Screening: (Low Dose CT Chest recommended if Age 87-80 years, 20 pack-year currently smoking OR have quit w/in 15years.) does not qualify.   Lung Cancer Screening Referral: na  Additional Screening:  Hepatitis C Screening: does not qualify   Vision Screening: Recommended annual ophthalmology exams for early detection of glaucoma and other disorders of the eye. Is the patient up to date with their annual eye exam?  Yes  Who is the provider or what is the name of the office in which the patient attends annual eye exams? [facility]    Dental Screening: Recommended annual dental exams for proper oral hygiene  Diabetic Foot Exam: na  Community Resource Referral / Chronic Care Management: CRR required this visit?  No   CCM required this visit?  No     Plan:     I have personally reviewed and noted the following in the patient's chart:   Medical and social history Use of alcohol, tobacco or illicit drugs  Current medications and supplements including opioid prescriptions. Patient is not currently taking opioid prescriptions. Functional ability and status Nutritional status Physical activity Advanced directives List of other physicians Hospitalizations, surgeries, and ER visits in previous 12 months Vitals Screenings to include cognitive, depression, and falls Referrals and appointments  In addition, I have reviewed and discussed with patient certain preventive protocols, quality metrics, and best practice recommendations. A written personalized  care plan for preventive services as well as general preventive health recommendations were provided to patient.     Jordan Hawks Estes Lehner, CMA   07/03/2023   After Visit Summary: (Mail) Due to this being a telephonic visit, the after visit summary with patients personalized plan was offered to patient via mail

## 2023-08-01 ENCOUNTER — Telehealth: Payer: Self-pay | Admitting: Internal Medicine

## 2023-08-01 NOTE — Telephone Encounter (Signed)
Corrie Dandy called from house calls [employer] care. Saw patient yesterday screening for artery disease low on both feet mostly on the right 0.37 and left 0.57. reports will be sent to our office.   Call [facility] with questions or concerns [phone]

## 2023-08-15 ENCOUNTER — Ambulatory Visit (INDEPENDENT_AMBULATORY_CARE_PROVIDER_SITE_OTHER): Payer: Medicare Other | Admitting: Internal Medicine

## 2023-08-15 ENCOUNTER — Encounter: Payer: Self-pay | Admitting: Internal Medicine

## 2023-08-15 VITALS — BP 130/68 | HR 68 | Ht 63.0 in | Wt 106.4 lb

## 2023-08-15 DIAGNOSIS — F5104 Psychophysiologic insomnia: Secondary | ICD-10-CM

## 2023-08-15 DIAGNOSIS — I10 Essential (primary) hypertension: Secondary | ICD-10-CM | POA: Diagnosis not present

## 2023-08-15 DIAGNOSIS — E785 Hyperlipidemia, unspecified: Secondary | ICD-10-CM | POA: Diagnosis not present

## 2023-08-15 NOTE — Progress Notes (Signed)
Established Patient Office Visit  Subjective   Patient ID: Shelby Phelps, female    DOB: 09-06-1931  Age: 89 y.o. MRN: [id number]  Chief Complaint  Patient presents with   Hypertension    Three month follow up    Ms. Mateja returns to care today for routine follow-up.  She was last evaluated by me on 8/13 as a new patient presenting to establish care.  No medication changes were made at that time and 82-month follow-up was arranged.  There have been no acute interval events.  Ms. Maffeo reports feeling well today.  She is asymptomatic and has no acute concerns to discuss.  Past Medical History:  Diagnosis Date   Glaucoma    Hypercholesteremia    Hypertension    Past Surgical History:  Procedure Laterality Date   CATARACT EXTRACTION Left    [facility] 2006   CATARACT EXTRACTION W/PHACO Right 02/25/2015   Procedure: CATARACT EXTRACTION PHACO AND INTRAOCULAR LENS PLACEMENT:  CDE:  9.25;  Surgeon: Gemma Payor, MD;  Location: [facility];  Service: Ophthalmology;  Laterality: Right;   Social History   Tobacco Use   Smoking status: Never  Vaping Use   Vaping status: Never Used  Substance Use Topics   Alcohol use: No   Drug use: No   Family History  Problem Relation Age of Onset   Cancer Other    No Known Allergies  Review of Systems  Constitutional:  Negative for chills and fever.  HENT:  Negative for sore throat.   Respiratory:  Negative for cough and shortness of breath.   Cardiovascular:  Negative for chest pain, palpitations and leg swelling.  Gastrointestinal:  Negative for abdominal pain, blood in stool, constipation, diarrhea, nausea and vomiting.  Genitourinary:  Negative for dysuria and hematuria.  Musculoskeletal:  Negative for myalgias.  Skin:  Negative for itching and rash.  Neurological:  Negative for dizziness and headaches.  Psychiatric/Behavioral:  Negative for depression and suicidal ideas.      Objective:     BP 130/68   Pulse 68   Ht 5\' 3"  (1.6 m)    Wt 106 lb 6.4 oz (48.3 kg)   SpO2 96%   BMI 18.85 kg/m  BP Readings from Last 3 Encounters:  08/15/23 130/68  05/15/23 128/62  01/28/22 (!) 185/82   Physical Exam Vitals reviewed.  Constitutional:      General: She is not in acute distress.    Appearance: Normal appearance. She is not toxic-appearing.  HENT:     Head: Normocephalic and atraumatic.     Right Ear: External ear normal.     Left Ear: External ear normal.     Nose: Nose normal. No congestion or rhinorrhea.     Mouth/Throat:     Mouth: Mucous membranes are moist.     Pharynx: Oropharynx is clear. No oropharyngeal exudate or posterior oropharyngeal erythema.  Eyes:     General: No scleral icterus.    Extraocular Movements: Extraocular movements intact.     Conjunctiva/sclera: Conjunctivae normal.     Pupils: Pupils are equal, round, and reactive to light.  Cardiovascular:     Rate and Rhythm: Normal rate and regular rhythm.     Pulses: Normal pulses.     Heart sounds: Normal heart sounds. No murmur heard.    No friction rub. No gallop.  Pulmonary:     Effort: Pulmonary effort is normal.     Breath sounds: Normal breath sounds. No wheezing, rhonchi or rales.  Abdominal:     General: Abdomen is flat. Bowel sounds are normal. There is no distension.     Palpations: Abdomen is soft.     Tenderness: There is no abdominal tenderness.  Musculoskeletal:        General: No swelling. Normal range of motion.     Cervical back: Normal range of motion.     Right lower leg: No edema.     Left lower leg: No edema.  Lymphadenopathy:     Cervical: No cervical adenopathy.  Skin:    General: Skin is warm and dry.     Capillary Refill: Capillary refill takes less than 2 seconds.     Coloration: Skin is not jaundiced.  Neurological:     General: No focal deficit present.     Mental Status: She is alert and oriented to person, place, and time.     Gait: Gait abnormal (Ambulates with cane).  Psychiatric:        Mood and  Affect: Mood normal.        Behavior: Behavior normal.   Last CBC Lab Results  Component Value Date   WBC 5.4 01/28/2022   HGB 13.9 01/28/2022   HCT 39.8 01/28/2022   MCV 84.7 01/28/2022   MCH 29.6 01/28/2022   RDW 12.1 01/28/2022   PLT 262 01/28/2022   Last metabolic panel Lab Results  Component Value Date   GLUCOSE 124 (H) 01/28/2022   NA 129 (L) 01/28/2022   K 3.7 01/28/2022   CL 95 (L) 01/28/2022   CO2 26 01/28/2022   BUN 11 01/28/2022   CREATININE 0.70 01/28/2022   GFRNONAA >60 01/28/2022   CALCIUM 9.9 01/28/2022   ANIONGAP 8 01/28/2022     Assessment & Plan:   Problem List Items Addressed This Visit       Essential hypertension - Primary    Remains adequately controlled on current antihypertensive regimen.  No medication changes are indicated today.      Hyperlipidemia    She remains on atorvastatin 40 mg daily.  Repeat lipid panel at follow-up in 6 months.      Insomnia    Trazodone 50 mg nightly remains effective.      Return in about 6 months (around 02/12/2024).   Billie Lade, MD

## 2023-08-15 NOTE — Assessment & Plan Note (Signed)
She remains on atorvastatin 40 mg daily.  Repeat lipid panel at follow-up in 6 months.

## 2023-08-15 NOTE — Assessment & Plan Note (Signed)
 Remains adequately controlled on current antihypertensive regimen.  No medication changes are indicated today.

## 2023-08-15 NOTE — Assessment & Plan Note (Signed)
Trazodone 50 mg nightly remains effective.

## 2023-08-15 NOTE — Patient Instructions (Signed)
It was a pleasure to see you today.  Thank you for giving [place] the opportunity to be involved in your care.  Below is a brief recap of your visit and next steps.  We will plan to see you again in 6 months  Summary No medication changes today Follow up in 6 months

## 2024-02-07 ENCOUNTER — Ambulatory Visit (INDEPENDENT_AMBULATORY_CARE_PROVIDER_SITE_OTHER): Payer: Medicare Other | Admitting: Internal Medicine

## 2024-02-07 ENCOUNTER — Encounter: Payer: Self-pay | Admitting: Internal Medicine

## 2024-02-07 VITALS — BP 118/62 | HR 69 | Ht 63.0 in | Wt 106.0 lb

## 2024-02-07 DIAGNOSIS — M810 Age-related osteoporosis without current pathological fracture: Secondary | ICD-10-CM | POA: Diagnosis not present

## 2024-02-07 DIAGNOSIS — I10 Essential (primary) hypertension: Secondary | ICD-10-CM | POA: Diagnosis not present

## 2024-02-07 DIAGNOSIS — E785 Hyperlipidemia, unspecified: Secondary | ICD-10-CM | POA: Diagnosis not present

## 2024-02-07 DIAGNOSIS — F5104 Psychophysiologic insomnia: Secondary | ICD-10-CM

## 2024-02-07 MED ORDER — TRAZODONE HCL 50 MG PO TABS: 50.0000 mg | ORAL_TABLET | Freq: Every day | ORAL | 3 refills | Status: AC

## 2024-02-07 MED ORDER — ATORVASTATIN CALCIUM 40 MG PO TABS: 40.0000 mg | ORAL_TABLET | Freq: Every day | ORAL | 3 refills | Status: AC

## 2024-02-07 MED ORDER — LOSARTAN POTASSIUM 100 MG PO TABS: 100.0000 mg | ORAL_TABLET | Freq: Every morning | ORAL | 3 refills | Status: AC

## 2024-02-07 NOTE — Assessment & Plan Note (Signed)
 Currently prescribed atorvastatin 40 mg daily.  Repeat lipid panel ordered today.

## 2024-02-07 NOTE — Assessment & Plan Note (Signed)
 T-score -2.9 on DEXA from April 2023.  She is currently taking daily vitamin D supplementation.  Repeat vitamin D level ordered today.

## 2024-02-07 NOTE — Assessment & Plan Note (Signed)
 Remains adequately controlled with losartan  100 mg daily.  No medication changes are indicated today.

## 2024-02-07 NOTE — Progress Notes (Signed)
 Established Patient Office Visit  Subjective   Patient ID: Shelby Phelps, female    DOB: 10-May-1931  Age: 89 y.o. MRN: [id number]  Chief Complaint  Patient presents with   Hypertension    Six month follow up    Ms. Shelby Phelps returns to care today for routine follow-up.  She was last evaluated by me in November 2024.  No medication changes were made at that time and 25-month follow-up was arranged.  There have been no acute interval events. Today she reports feeling well and has no acute concerns to discuss.   Past Medical History:  Diagnosis Date   Glaucoma    Hypercholesteremia    Hypertension    Past Surgical History:  Procedure Laterality Date   CATARACT EXTRACTION Left    [facility] 2006   CATARACT EXTRACTION W/PHACO Right 02/25/2015   Procedure: CATARACT EXTRACTION PHACO AND INTRAOCULAR LENS PLACEMENT:  CDE:  9.25;  Surgeon: Anner Kill, MD;  Location: [facility];  Service: Ophthalmology;  Laterality: Right;   Social History   Tobacco Use   Smoking status: Never  Vaping Use   Vaping status: Never Used  Substance Use Topics   Alcohol use: No   Drug use: No   Family History  Problem Relation Age of Onset   Cancer Other    No Known Allergies  Review of Systems  Constitutional:  Negative for chills and fever.  HENT:  Negative for sore throat.   Respiratory:  Negative for cough and shortness of breath.   Cardiovascular:  Negative for chest pain, palpitations and leg swelling.  Gastrointestinal:  Negative for abdominal pain, blood in stool, constipation, diarrhea, nausea and vomiting.  Genitourinary:  Negative for dysuria and hematuria.  Musculoskeletal:  Negative for myalgias.  Skin:  Negative for itching and rash.  Neurological:  Negative for dizziness and headaches.  Psychiatric/Behavioral:  Negative for depression and suicidal ideas.      Objective:     BP 118/62   Pulse 69   Ht 5\' 3"  (1.6 m)   Wt 106 lb (48.1 kg)   SpO2 95%   BMI 18.78 kg/m  BP Readings  from Last 3 Encounters:  02/07/24 118/62  08/15/23 130/68  05/15/23 128/62   Physical Exam Vitals reviewed.  Constitutional:      General: She is not in acute distress.    Appearance: Normal appearance. She is not toxic-appearing.  HENT:     Head: Normocephalic and atraumatic.     Right Ear: External ear normal.     Left Ear: External ear normal.     Nose: Nose normal. No congestion or rhinorrhea.     Mouth/Throat:     Mouth: Mucous membranes are moist.     Pharynx: Oropharynx is clear. No oropharyngeal exudate or posterior oropharyngeal erythema.  Eyes:     General: No scleral icterus.    Extraocular Movements: Extraocular movements intact.     Conjunctiva/sclera: Conjunctivae normal.     Pupils: Pupils are equal, round, and reactive to light.  Cardiovascular:     Rate and Rhythm: Normal rate and regular rhythm.     Pulses: Normal pulses.     Heart sounds: Normal heart sounds. No murmur heard.    No friction rub. No gallop.  Pulmonary:     Effort: Pulmonary effort is normal.     Breath sounds: Normal breath sounds. No wheezing, rhonchi or rales.  Abdominal:     General: Abdomen is flat. Bowel sounds are normal. There is  no distension.     Palpations: Abdomen is soft.     Tenderness: There is no abdominal tenderness.  Musculoskeletal:        General: No swelling. Normal range of motion.     Cervical back: Normal range of motion.     Right lower leg: No edema.     Left lower leg: No edema.  Lymphadenopathy:     Cervical: No cervical adenopathy.  Skin:    General: Skin is warm and dry.     Capillary Refill: Capillary refill takes less than 2 seconds.     Coloration: Skin is not jaundiced.  Neurological:     General: No focal deficit present.     Mental Status: She is alert and oriented to person, place, and time.     Gait: Gait abnormal (ambulates with a cane).  Psychiatric:        Mood and Affect: Mood normal.        Behavior: Behavior normal.   Last CBC Lab  Results  Component Value Date   WBC 5.4 01/28/2022   HGB 13.9 01/28/2022   HCT 39.8 01/28/2022   MCV 84.7 01/28/2022   MCH 29.6 01/28/2022   RDW 12.1 01/28/2022   PLT 262 01/28/2022   Last metabolic panel Lab Results  Component Value Date   GLUCOSE 124 (H) 01/28/2022   NA 129 (L) 01/28/2022   K 3.7 01/28/2022   CL 95 (L) 01/28/2022   CO2 26 01/28/2022   BUN 11 01/28/2022   CREATININE 0.70 01/28/2022   GFRNONAA >60 01/28/2022   CALCIUM 9.9 01/28/2022   ANIONGAP 8 01/28/2022     Assessment & Plan:   Problem List Items Addressed This Visit       Essential hypertension - Primary   Remains adequately controlled with losartan 100 mg daily.  No medication changes are indicated today.      Osteoporosis   T-score -2.9 on DEXA from April 2023.  She is currently taking daily vitamin D supplementation.  Repeat vitamin D level ordered today.      Hyperlipidemia   Currently prescribed atorvastatin 40 mg daily.  Repeat lipid panel ordered today.      Insomnia   Adequately controlled with trazodone 50 mg nightly.  No changes are indicated today.      Return in about 6 months (around 08/09/2024).   Shelby Fortes, MD

## 2024-02-07 NOTE — Assessment & Plan Note (Signed)
 Adequately controlled with trazodone 50 mg nightly.  No changes are indicated today.

## 2024-02-07 NOTE — Patient Instructions (Signed)
 It was a pleasure to see you today.  Thank you for giving [place] the opportunity to be involved in your care.  Below is a brief recap of your visit and next steps.  We will plan to see you again in 6 months.  Summary No medication changes today Refills provided Repeat labs Follow up in 6 months

## 2024-02-08 ENCOUNTER — Other Ambulatory Visit: Payer: Self-pay | Admitting: Internal Medicine

## 2024-02-08 LAB — LIPID PANEL
Chol/HDL Ratio: 2 ratio (ref 0.0–4.4)
Cholesterol, Total: 175 mg/dL (ref 100–199)
HDL: 88 mg/dL (ref >39)
LDL Chol Calc (NIH): 77 mg/dL (ref 0–99)
Triglycerides: 48 mg/dL (ref 0–149)
VLDL Cholesterol Cal: 10 mg/dL (ref 5–40)

## 2024-02-08 LAB — CBC WITH DIFFERENTIAL/PLATELET
Basophils Absolute: 0 10*3/uL (ref 0.0–0.2)
Basos: 1 %
EOS (ABSOLUTE): 0.1 10*3/uL (ref 0.0–0.4)
Eos: 2 %
Hematocrit: 38.9 % (ref 34.0–46.6)
Hemoglobin: 13.2 g/dL (ref 11.1–15.9)
Immature Grans (Abs): 0 10*3/uL (ref 0.0–0.1)
Immature Granulocytes: 0 %
Lymphocytes Absolute: 2 10*3/uL (ref 0.7–3.1)
Lymphs: 48 %
MCH: 29.5 pg (ref 26.6–33.0)
MCHC: 33.9 g/dL (ref 31.5–35.7)
MCV: 87 fL (ref 79–97)
Monocytes Absolute: 0.5 10*3/uL (ref 0.1–0.9)
Monocytes: 11 %
Neutrophils Absolute: 1.6 10*3/uL (ref 1.4–7.0)
Neutrophils: 38 %
Platelets: 249 10*3/uL (ref 150–450)
RBC: 4.47 x10E6/uL (ref 3.77–5.28)
RDW: 12.4 % (ref 11.7–15.4)
WBC: 4.2 10*3/uL (ref 3.4–10.8)

## 2024-02-08 LAB — CMP14+EGFR
ALT: 17 IU/L (ref 0–32)
AST: 24 IU/L (ref 0–40)
Albumin: 4.2 g/dL (ref 3.6–4.6)
Alkaline Phosphatase: 74 IU/L (ref 44–121)
BUN/Creatinine Ratio: 14 (ref 12–28)
BUN: 11 mg/dL (ref 10–36)
Bilirubin Total: 0.5 mg/dL (ref 0.0–1.2)
CO2: 24 mmol/L (ref 20–29)
Calcium: 10.8 mg/dL — ABNORMAL HIGH (ref 8.7–10.3)
Chloride: 93 mmol/L — ABNORMAL LOW (ref 96–106)
Creatinine, Ser: 0.77 mg/dL (ref 0.57–1.00)
Globulin, Total: 2.3 g/dL (ref 1.5–4.5)
Glucose: 103 mg/dL — ABNORMAL HIGH (ref 70–99)
Potassium: 3.7 mmol/L (ref 3.5–5.2)
Sodium: 132 mmol/L — ABNORMAL LOW (ref 134–144)
Total Protein: 6.5 g/dL (ref 6.0–8.5)
eGFR: 73 mL/min/{1.73_m2} (ref >59)

## 2024-02-08 LAB — TSH+FREE T4
Free T4: 1.18 ng/dL (ref 0.82–1.77)
TSH: 1.29 u[IU]/mL (ref 0.450–4.500)

## 2024-02-08 LAB — VITAMIN D 25 HYDROXY (VIT D DEFICIENCY, FRACTURES): Vit D, 25-Hydroxy: 43.7 ng/mL (ref 30.0–100.0)

## 2024-02-08 LAB — B12 AND FOLATE PANEL
Folate: 10.2 ng/mL (ref >3.0)
Vitamin B-12: 1578 pg/mL — ABNORMAL HIGH (ref 232–1245)

## 2024-02-14 ENCOUNTER — Ambulatory Visit: Payer: Medicare Other | Admitting: Internal Medicine

## 2024-03-14 ENCOUNTER — Ambulatory Visit: Payer: Self-pay | Admitting: Internal Medicine

## 2024-03-15 ENCOUNTER — Ambulatory Visit: Payer: Self-pay | Admitting: Internal Medicine

## 2024-03-15 LAB — CMP14+EGFR
ALT: 10 IU/L (ref 0–32)
AST: 19 IU/L (ref 0–40)
Albumin: 4.3 g/dL (ref 3.6–4.6)
Alkaline Phosphatase: 76 IU/L (ref 44–121)
BUN/Creatinine Ratio: 14 (ref 12–28)
BUN: 11 mg/dL (ref 10–36)
Bilirubin Total: 0.7 mg/dL (ref 0.0–1.2)
CO2: 23 mmol/L (ref 20–29)
Calcium: 10.5 mg/dL — ABNORMAL HIGH (ref 8.7–10.3)
Chloride: 95 mmol/L — ABNORMAL LOW (ref 96–106)
Creatinine, Ser: 0.81 mg/dL (ref 0.57–1.00)
Globulin, Total: 1.9 g/dL (ref 1.5–4.5)
Glucose: 88 mg/dL (ref 70–99)
Potassium: 3.7 mmol/L (ref 3.5–5.2)
Sodium: 133 mmol/L — ABNORMAL LOW (ref 134–144)
Total Protein: 6.2 g/dL (ref 6.0–8.5)
eGFR: 68 mL/min/{1.73_m2} (ref >59)

## 2024-04-24 ENCOUNTER — Other Ambulatory Visit: Payer: Self-pay

## 2024-04-24 MED ORDER — HYDROCHLOROTHIAZIDE 25 MG PO TABS: 25.0000 mg | ORAL_TABLET | Freq: Every day | ORAL | 1 refills | Status: DC

## 2024-04-24 NOTE — Telephone Encounter (Signed)
 Copied from CRM [id number]. Topic: Clinical - Prescription Issue >> Apr 24, 2024 10:11 AM Zebedee SAUNDERS wrote: Reason for CRM: Pt called stated Dr. Melvenia had prescribed for the last 23 years hydrochlorothiazide  25 mg tablet, but this was left out when she pick up medication at pharmacy. Pt has scheduled appt with Leita Longs NP on 08/11/2024. Please refill medication if appropriate.

## 2024-04-24 NOTE — Telephone Encounter (Signed)
 Per [facility] patient was receiving this rx from Dr. Knowlton, who has now retired.  Please advise if ok for refill until she is seen in office. Thank you!

## 2024-07-07 ENCOUNTER — Ambulatory Visit: Payer: Medicare Other

## 2024-07-07 VITALS — Ht 63.0 in | Wt 106.0 lb

## 2024-07-07 DIAGNOSIS — Z0000 Encounter for general adult medical examination without abnormal findings: Secondary | ICD-10-CM | POA: Diagnosis not present

## 2024-07-07 NOTE — Patient Instructions (Signed)
 Ms. Rather,  Thank you for taking the time for your Medicare Wellness Visit. I appreciate your continued commitment to your health goals. Please review the care plan we discussed, and feel free to reach out if I can assist you further.  Medicare recommends these wellness visits once per year to help you and your care team stay ahead of potential health issues. These visits are designed to focus on prevention, allowing your provider to concentrate on managing your acute and chronic conditions during your regular appointments.  Please note that Annual Wellness Visits do not include a physical exam. Some assessments may be limited, especially if the visit was conducted virtually. If needed, we may recommend a separate in-person follow-up with your provider.  Wishing you excellent health and many blessings in the year to come!  -Daryll Spisak, CMA  Ongoing Care Seeing your primary care provider every 3 to 6 months helps us  monitor your health and provide consistent, personalized care.   Recommended Screenings:  Health Maintenance  Topic Date Due   DTaP/Tdap/Td vaccine (1 - Tdap) Never done   Pneumococcal Vaccine for age over 75 (1 of 1 - PCV) Never done   Zoster (Shingles) Vaccine (1 of 2) Never done   DEXA scan (bone density measurement)  01/17/2024   Flu Shot  05/02/2024   COVID-19 Vaccine (1 - 2024-25 season) Never done   Medicare Annual Wellness Visit  07/07/2025   Meningitis B Vaccine  Aged Out       07/07/2024    9:29 AM  Advanced Directives  Does Patient Have a Medical Advance Directive? No  Would patient like information on creating a medical advance directive? No - Patient declined   Advance Care Planning is important because it: Ensures you receive medical care that aligns with your values, goals, and preferences. Provides guidance to your family and loved ones, reducing the emotional burden of decision-making during critical moments.  Vision: Annual vision screenings are  recommended for early detection of glaucoma, cataracts, and diabetic retinopathy. These exams can also reveal signs of chronic conditions such as diabetes and high blood pressure.  Dental: Annual dental screenings help detect early signs of oral cancer, gum disease, and other conditions linked to overall health, including heart disease and diabetes.  Please see the attached documents for additional preventive care recommendations.

## 2024-07-07 NOTE — Progress Notes (Signed)
 Subjective:   Shelby Phelps is a 89 y.o. who presents for a Medicare Wellness preventive visit.  As a reminder, Annual Wellness Visits don't include a physical exam, and some assessments may be limited, especially if this visit is performed virtually. We may recommend an in-person follow-up visit with your provider if needed.  Visit Complete: Virtual I connected with  Shelby Phelps on 07/07/24 by a audio enabled telemedicine application and verified that I am speaking with the correct person using two identifiers.  Patient Location: Home  Provider Location: Home Office  I discussed the limitations of evaluation and management by telemedicine. The patient expressed understanding and agreed to proceed.  Vital Signs: Because this visit was a virtual/telehealth visit, some criteria may be missing or patient reported. Any vitals not documented were not able to be obtained and vitals that have been documented are patient reported.  VideoDeclined- This patient declined Librarian, academic. Therefore the visit was completed with audio only.  Persons Participating in Visit: Patient.  AWV Questionnaire: No: Patient Medicare AWV questionnaire was not completed prior to this visit.  Cardiac Risk Factors include: advanced age (>12men, >5 women);dyslipidemia;hypertension     Objective:    Today's Vitals   07/07/24 0929  Weight: 106 lb (48.1 kg)  Height: 5' 3 (1.6 m)   Body mass index is 18.78 kg/m.     07/07/2024    9:29 AM 07/03/2023    2:24 PM 01/28/2022    2:39 PM 02/25/2015    9:31 AM 02/22/2015    9:20 AM  Advanced Directives  Does Patient Have a Medical Advance Directive? No No No No  No   Would patient like information on creating a medical advance directive? No - Patient declined No - Patient declined        Data saved with a previous flowsheet row definition    Current Medications (verified) Outpatient Encounter Medications as of 07/07/2024   Medication Sig   atorvastatin  (LIPITOR) 40 MG tablet Take 1 tablet (40 mg total) by mouth daily.   Cholecalciferol 5000 UNITS capsule Take 5,000 Units by mouth daily.   hydrochlorothiazide  (HYDRODIURIL ) 25 MG tablet Take 1 tablet (25 mg total) by mouth daily.   latanoprost (XALATAN) 0.005 % ophthalmic solution Place 1 drop into both eyes at bedtime.   losartan  (COZAAR ) 100 MG tablet Take 1 tablet (100 mg total) by mouth every morning.   traZODone  (DESYREL ) 50 MG tablet Take 1 tablet (50 mg total) by mouth at bedtime.   vitamin B-12 (CYANOCOBALAMIN) 100 MCG tablet Take 100 mcg by mouth daily.   No facility-administered encounter medications on file as of 07/07/2024.    Allergies (verified) Patient has no known allergies.   History: Past Medical History:  Diagnosis Date   Glaucoma    Hypercholesteremia    Hypertension    Past Surgical History:  Procedure Laterality Date   CATARACT EXTRACTION Left    [facility] 2006   CATARACT EXTRACTION W/PHACO Right 02/25/2015   Procedure: CATARACT EXTRACTION PHACO AND INTRAOCULAR LENS PLACEMENT:  CDE:  9.25;  Surgeon: Shelby Phelps;  Location: [facility];  Service: Ophthalmology;  Laterality: Right;   Family History  Problem Relation Age of Onset   Cancer Other    Social History   Socioeconomic History   Marital status: Widowed    Spouse name: Not on file   Number of children: Not on file   Years of education: Not on file   Highest education level:  Not on file  Occupational History   Not on file  Tobacco Use   Smoking status: Never   Smokeless tobacco: Not on file  Vaping Use   Vaping status: Never Used  Substance and Sexual Activity   Alcohol use: No   Drug use: No   Sexual activity: Never    Birth control/protection: Post-menopausal  Other Topics Concern   Not on file  Social History Narrative   Not on file   Social Drivers of Health   Financial Resource Strain: Low Risk  (07/07/2024)   Overall Financial Resource Strain  (CARDIA)    Difficulty of Paying Living Expenses: Not hard at all  Food Insecurity: No Food Insecurity (07/07/2024)   Hunger Vital Sign    Worried About Running Out of Food in the Last Year: Never true    Ran Out of Food in the Last Year: Never true  Transportation Needs: No Transportation Needs (07/07/2024)   PRAPARE - Administrator, Civil Service (Medical): No    Lack of Transportation (Non-Medical): No  Physical Activity: Sufficiently Active (07/07/2024)   Exercise Vital Sign    Days of Exercise per Week: 7 days    Minutes of Exercise per Session: 30 min  Stress: No Stress Concern Present (07/07/2024)   [employer] of Occupational Health - Occupational Stress Questionnaire    Feeling of Stress: Not at all  Social Connections: Moderately Integrated (07/07/2024)   Social Connection and Isolation Panel    Frequency of Communication with Friends and Family: More than three times a week    Frequency of Social Gatherings with Friends and Family: More than three times a week    Attends Religious Services: More than 4 times per year    Active Member of [employer] or Organizations: Yes    Attends Banker Meetings: More than 4 times per year    Marital Status: Widowed    Tobacco Counseling Counseling given: Not Answered    Clinical Intake:  Pre-visit preparation completed: Yes  Pain : No/denies pain     BMI - recorded: 18.78 Nutritional Status: BMI <19  Underweight Nutritional Risks: None Diabetes: No  No results found for: HGBA1C   How often do you need to have someone help you when you read instructions, pamphlets, or other written materials from your doctor or pharmacy?: 1 - Never  Interpreter Needed?: No  Information entered by :: Starlin Steib W CMA (AAMA)   Activities of Daily Living     07/07/2024    9:32 AM  In your present state of health, do you have any difficulty performing the following activities:  Hearing? 0  Vision? 0  Difficulty  concentrating or making decisions? 0  Walking or climbing stairs? 0  Dressing or bathing? 0  Doing errands, shopping? 1  Comment patient no longer drives. daughter drives her to her appts  Preparing Food and eating ? N  Using the Toilet? N  In the past six months, have you accidently leaked urine? N  Do you have problems with loss of bowel control? N  Managing your Medications? N  Managing your Finances? N  Housekeeping or managing your Housekeeping? N    Patient Care Team: Bevely Doffing, FNP as PCP - General (Family Medicine) Dr [facility], OD (Optometry)  I have updated your Care Teams any recent Medical Services you may have received from other providers in the past year.     Assessment:   This is a routine  wellness examination for Io.  Hearing/Vision screen Hearing Screening - Comments:: Patient denies any hearing difficulties.   Vision Screening - Comments:: Wears rx glasses - up to date with routine eye exams with  Willma Nicholaus [facility]   Goals Addressed               This Visit's Progress     Remain healthy and active (pt-stated)          Depression Screen     07/07/2024    9:34 AM 02/07/2024    1:41 PM 08/15/2023    1:45 PM 07/03/2023    2:28 PM 05/15/2023    1:48 PM  PHQ 2/9 Scores  PHQ - 2 Score 0 0 0 0 0  PHQ- 9 Score 0 0 0 0 2     Fall Risk     07/07/2024    9:31 AM 02/07/2024    1:41 PM 08/15/2023    1:45 PM 07/03/2023    2:33 PM 05/15/2023    1:48 PM  Fall Risk   Falls in the past year? 0 0 0 0 0  Number falls in past yr: 0 0 0 0 0  Injury with Fall? 0 0 0 0 0  Risk for fall due to : No Fall Risks No Fall Risks No Fall Risks No Fall Risks No Fall Risks  Follow up Falls evaluation completed;Education provided;Falls prevention discussed Falls evaluation completed;Education provided;Falls prevention discussed Falls evaluation completed Falls prevention discussed Falls evaluation completed    MEDICARE RISK AT HOME:   Medicare Risk at Home Any stairs in or around the home?: Yes If so, are there any without handrails?: No Home free of loose throw rugs in walkways, pet beds, electrical cords, etc?: Yes Adequate lighting in your home to reduce risk of falls?: Yes Life alert?: No Use of a cane, walker or w/c?: Yes Grab bars in the bathroom?: Yes Shower chair or bench in shower?: Yes Elevated toilet seat or a handicapped toilet?: Yes  TIMED UP AND GO:  Was the test performed?  No  Cognitive Function: 6CIT completed        07/07/2024    9:33 AM 07/03/2023    2:28 PM  6CIT Screen  What Year? 0 points 0 points  What month? 0 points 0 points  What time? 0 points 0 points  Count back from 20 0 points 0 points  Months in reverse 0 points 0 points  Repeat phrase 0 points 0 points  Total Score 0 points 0 points    Immunizations Immunization History  Administered Date(s) Administered   Fluad Quad(high Dose 65+) 08/02/2023    Screening Tests Health Maintenance  Topic Date Due   DTaP/Tdap/Td (1 - Tdap) Never done   Pneumococcal Vaccine: 50+ Years (1 of 1 - PCV) Never done   Zoster Vaccines- Shingrix (1 of 2) Never done   DEXA SCAN  01/17/2024   Influenza Vaccine  05/02/2024   COVID-19 Vaccine (1 - 2024-25 season) Never done   Medicare Annual Wellness (AWV)  07/07/2025   Meningococcal B Vaccine  Aged Out    Health Maintenance Health Maintenance Due  Topic Date Due   DTaP/Tdap/Td (1 - Tdap) Never done   Pneumococcal Vaccine: 50+ Years (1 of 1 - PCV) Never done   Zoster Vaccines- Shingrix (1 of 2) Never done   DEXA SCAN  01/17/2024   Influenza Vaccine  05/02/2024   COVID-19 Vaccine (1 - 2024-25 season) Never done   Health Maintenance  Items Addressed:  Patient declined bone density screening. She is aware of recommended vaccines and where she can have those done at.   Additional Screening:  Vision Screening: Recommended annual ophthalmology exams for early detection of glaucoma and  other disorders of the eye. Would you like a referral to an eye doctor? No    Dental Screening: Recommended annual dental exams for proper oral hygiene  Community Resource Referral / Chronic Care Management: CRR required this visit?  No   CCM required this visit?  No   Plan:    I have personally reviewed and noted the following in the patient's chart:   Medical and social history Use of alcohol, tobacco or illicit drugs  Current medications and supplements including opioid prescriptions. Patient is not currently taking opioid prescriptions. Functional ability and status Nutritional status Physical activity Advanced directives List of other physicians Hospitalizations, surgeries, and ER visits in previous 12 months Vitals Screenings to include cognitive, depression, and falls Referrals and appointments  In addition, I have reviewed and discussed with patient certain preventive protocols, quality metrics, and best practice recommendations. A written personalized care plan for preventive services as well as general preventive health recommendations were provided to patient.   Thaila Bottoms, CMA   07/07/2024   After Visit Summary: (MyChart) Due to this being a telephonic visit, the after visit summary with patients personalized plan was offered to patient via MyChart   Notes: Nothing significant to report at this time.

## 2024-08-11 ENCOUNTER — Ambulatory Visit

## 2024-08-11 VITALS — BP 140/72 | HR 62 | Ht 63.0 in | Wt 106.0 lb

## 2024-08-11 DIAGNOSIS — Z23 Encounter for immunization: Secondary | ICD-10-CM

## 2024-08-11 DIAGNOSIS — I10 Essential (primary) hypertension: Secondary | ICD-10-CM

## 2024-08-11 NOTE — Progress Notes (Unsigned)
   Established Patient Office Visit  Subjective   Patient ID: DENEAN PAVON, female    DOB: 1931/04/09  Age: 89 y.o. MRN: [id number]  Chief Complaint  Patient presents with   Medical Management of Chronic Issues    3 month follow up, per daughter wants you to look at the paperwork from [facility] for caregiver leave for Vina Slade for parent Renisha Cockrum    HPI  Patient Active Problem List   Diagnosis Date Noted   Osteoporosis 02/07/2024   Essential hypertension 05/15/2023   Hyperlipidemia 05/15/2023   Insomnia 05/15/2023      ROS    Objective:     BP (!) 188/48   Pulse 62   Ht 5' 3 (1.6 m)   Wt 106 lb (48.1 kg)   SpO2 97%   BMI 18.78 kg/m  BP Readings from Last 3 Encounters:  08/11/24 (!) 188/48  02/07/24 118/62  08/15/23 130/68   Wt Readings from Last 3 Encounters:  08/11/24 106 lb (48.1 kg)  07/07/24 106 lb (48.1 kg)  02/07/24 106 lb (48.1 kg)      Physical Exam   No results found for any visits on 08/11/24.  Last CBC Lab Results  Component Value Date   WBC 4.2 02/07/2024   HGB 13.2 02/07/2024   HCT 38.9 02/07/2024   MCV 87 02/07/2024   MCH 29.5 02/07/2024   RDW 12.4 02/07/2024   PLT 249 02/07/2024   Last metabolic panel Lab Results  Component Value Date   GLUCOSE 88 03/14/2024   NA 133 (L) 03/14/2024   K 3.7 03/14/2024   CL 95 (L) 03/14/2024   CO2 23 03/14/2024   BUN 11 03/14/2024   CREATININE 0.81 03/14/2024   EGFR 68 03/14/2024   CALCIUM  10.5 (H) 03/14/2024   PROT 6.2 03/14/2024   ALBUMIN 4.3 03/14/2024   LABGLOB 1.9 03/14/2024   BILITOT 0.7 03/14/2024   ALKPHOS 76 03/14/2024   AST 19 03/14/2024   ALT 10 03/14/2024   ANIONGAP 8 01/28/2022   Last lipids Lab Results  Component Value Date   CHOL 175 02/07/2024   HDL 88 02/07/2024   LDLCALC 77 02/07/2024   TRIG 48 02/07/2024   CHOLHDL 2.0 02/07/2024   Last hemoglobin A1c No results found for: HGBA1C Last thyroid functions Lab Results  Component Value Date   TSH  1.290 02/07/2024   FREET4 1.18 02/07/2024   Last vitamin D  Lab Results  Component Value Date   VD25OH 43.7 02/07/2024   Last vitamin B12 and Folate Lab Results  Component Value Date   VITAMINB12 1,578 (H) 02/07/2024   FOLATE 10.2 02/07/2024      The ASCVD Risk score (Arnett DK, et al., 2019) failed to calculate for the following reasons:   The 2019 ASCVD risk score is only valid for ages 42 to 2    Assessment & Plan:   Problem List Items Addressed This Visit   None Visit Diagnoses       Encounter for immunization    -  Primary   Relevant Orders   Flu vaccine HIGH DOSE PF(Fluzone Trivalent) (Completed)       No follow-ups on file.    Leita Longs, FNP

## 2024-08-14 NOTE — Assessment & Plan Note (Signed)
 Remains adequately controlled with losartan  100 mg daily.  No medication changes are indicated today.

## 2024-09-04 ENCOUNTER — Telehealth: Payer: Self-pay

## 2024-09-04 NOTE — Telephone Encounter (Signed)
 error

## 2024-09-04 NOTE — Telephone Encounter (Signed)
 FMLA   NOTED COPIED SLEEVED ORIGINAL PLACED IN PROVIDERS BOX COPY P[LACED UPFRONT

## 2024-09-10 DIAGNOSIS — Z0279 Encounter for issue of other medical certificate: Secondary | ICD-10-CM

## 2024-10-16 ENCOUNTER — Other Ambulatory Visit: Payer: Self-pay

## 2024-10-21 ENCOUNTER — Telehealth: Payer: Self-pay

## 2024-10-21 NOTE — Telephone Encounter (Signed)
 Copied from CRM #[id number]. Topic: Clinical - Medication Question >> Oct 21, 2024  9:25 AM Tiffany B wrote: Reason for CRM: Patient checking on the status of her hydrochlorothiazide  (HYDRODIURIL ) 25 MG tablet. Patient chart reflects PCP sent script on 10/16/2024 to [facility] [place], [place] .Connected patient to her pharmacy to inquire about the status.   ----------------------------------------------------------------------- From previous Reason for Contact - Prescription Issue: Reason for CRM: Patient checking on the status of her hydrochlorothiazide  (HYDRODIURIL ) 25 MG tablet. Patient chart reflects PCP sent script on 10/16/2024 to [facility] [place], [place] .Connected patient to her pharmacy to inquire about the status.

## 2024-10-31 ENCOUNTER — Ambulatory Visit

## 2025-02-09 ENCOUNTER — Ambulatory Visit

## 2025-07-08 ENCOUNTER — Ambulatory Visit
